# Patient Record
Sex: Male | Born: 1995 | Race: White | Hispanic: No | Marital: Single | Smoking: Never smoker
Health system: Southern US, Community
[De-identification: ages and names within clinical notes are randomized; demographics above are authoritative.]

## PROBLEM LIST (undated history)

## (undated) HISTORY — PX: EXTERNAL EAR SURGERY: SHX627

## (undated) HISTORY — PX: ADENOIDECTOMY: SUR15

## (undated) HISTORY — PX: HYDROCELE EXCISION / REPAIR: SUR1145

---

## 2018-01-27 ENCOUNTER — Ambulatory Visit
Admission: EM | Admit: 2018-01-27 | Discharge: 2018-01-27 | Disposition: A | Discharge status: Home / Self Care | Payer: Commercial Managed Care - PPO | Attending: Family Medicine | Admitting: Family Medicine

## 2018-01-27 ENCOUNTER — Other Ambulatory Visit: Payer: Self-pay

## 2018-01-27 DIAGNOSIS — M25511 Pain in right shoulder: Secondary | ICD-10-CM | POA: Diagnosis not present

## 2018-01-27 MED ORDER — MELOXICAM 15 MG PO TABS: 15.0000 mg | ORAL_TABLET | Freq: Every day | ORAL | 0 refills | Status: DC

## 2018-01-27 NOTE — ED Provider Notes (Signed)
[facility]    CSN: [id number] Arrival date & time: 01/27/18  1200     History   Chief Complaint Chief Complaint  Patient presents with  . Shoulder Pain    right    HPI Kyle Tran is a 22 y.o. male.   HPI  -year-old male presents with right shoulder pain that started yesterday as he was putting on his shirt from shower.  Shoulder has been aching ever since.  He states with motion he feels a crackling sensation in his shoulder.  Has  neck has not been bothering him.  Able to move his shoulder through full range fairly comfortably.  He works in [employer] at [employer].  Will at times have to lift heavy items by himself if other team members are not available.  However he does not remember any specific incident that may have caused his symptoms.  They are busier than normal while setting up for black Friday.  Denies any numbness or tingling.         History reviewed. No pertinent past medical history.  There are no active problems to display for this patient.   Past Surgical History:  Procedure Laterality Date  . EXTERNAL EAR SURGERY         Home Medications    Prior to Admission medications   Medication Sig Start Date End Date Taking? Authorizing Provider  meloxicam (MOBIC) 15 MG tablet Take 1 tablet (15 mg total) by mouth daily. 01/27/18   Lutricia Feiloemer, Suhaas Agena P, PA-C    Family History History reviewed. No pertinent family history.  Social History Social History   Tobacco Use  . Smoking status: Never Smoker  . Smokeless tobacco: Never Used  Substance Use Topics  . Alcohol use: Yes    Comment: rarely  . Drug use: Not Currently     Allergies   Ceftin [cefuroxime]   Review of Systems Review of Systems  Constitutional: Positive for activity change. Negative for appetite change, chills, fatigue and fever.  Musculoskeletal: Positive for myalgias. Negative for neck pain and neck stiffness.  All other systems reviewed and  are negative.    Physical Exam Triage Vital Signs ED Triage Vitals  Enc Vitals Group     BP 01/27/18 1222 119/73     Pulse Rate 01/27/18 1222 78     Resp 01/27/18 1222 18     Temp 01/27/18 1222 99 F (37.2 C)     Temp Source 01/27/18 1222 Oral     SpO2 01/27/18 1222 100 %     Weight 01/27/18 1220 205 lb (93 kg)     Height 01/27/18 1220 6\' 1"  (1.854 m)     Head Circumference --      Peak Flow --      Pain Score 01/27/18 1220 2     Pain Loc --      Pain Edu? --      Excl. in GC? --    No data found.  Updated Vital Signs BP 119/73 (BP Location: Left Arm)   Pulse 78   Temp 99 F (37.2 C) (Oral)   Resp 18   Ht 6\' 1"  (1.854 m)   Wt 205 lb (93 kg)   SpO2 100%   BMI 27.05 kg/m   Visual Acuity Right Eye Distance:   Left Eye Distance:   Bilateral Distance:    Right Eye Near:   Left Eye Near:    Bilateral Near:     Physical Exam  Constitutional: He is oriented to person, place, and time. He appears well-developed and well-nourished. No distress.  HENT:  Head: Normocephalic.  Eyes: Pupils are equal, round, and reactive to light. Right eye exhibits no discharge. Left eye exhibits no discharge.  Neck: Normal range of motion. Neck supple.  Musculoskeletal: Normal range of motion.  Exam of the cervical spine shows full range of motion that is comfortable.  Exam of the right shoulder shows range of motion both actively and passively.  Has  good pulses distally.  Negative arm drop test and a negative if he can test but does complain of a tightness that he feels in his shoulder.  No tenderness of the clavicle subacromial or coracoid process.  Good strength of the arm.  Neurological: He is alert and oriented to person, place, and time.  Skin: Skin is warm and dry. He is not diaphoretic.  Psychiatric: He has a normal mood and affect. His behavior is normal. Judgment and thought content normal.  Nursing note and vitals reviewed.    UC Treatments / Results  Labs (all labs  ordered are listed, but only abnormal results are displayed) Labs Reviewed - No data to display  EKG None  Radiology No results found.  Procedures Procedures (including critical care time)  Medications Ordered in UC Medications - No data to display  Initial Impression / Assessment and Plan / UC Course  I have reviewed the triage vital signs and the nursing notes.  Pertinent labs & imaging results that were available during my care of the patient were reviewed by me and considered in my medical decision making (see chart for details).   Explained to the patient that his examination was fairly normal with mild symptoms and little  abnormalities.  Advised him to avoid symptoms much as possible.  I have told him we will treat this conservatively and does not indicate the need for x-rays at this point.  We will start him on anti-inflammatory medications and have him ice the shoulder particularly after working.  Heavy lifting I have asked him to make sure he has another team member assist him.  He continues to have pain or if it worsens he should return to our clinic or be seen by orthopedic surgery   Final Clinical Impressions(s) / UC Diagnoses   Final diagnoses:  Acute pain of right shoulder     Discharge Instructions     Apply ice 20 minutes out of every 2 hours 4-5 times daily for comfort.    ED Prescriptions    Medication Sig Dispense Auth. Provider   meloxicam (MOBIC) 15 MG tablet Take 1 tablet (15 mg total) by mouth daily. 30 tablet Lutricia Feil, PA-C     Controlled Substance Prescriptions [place] Controlled Substance Registry consulted? Not Applicable   Lutricia Feil, PA-C 01/27/18 1323

## 2018-01-27 NOTE — ED Triage Notes (Signed)
Patient complains of right shoulder pain that started yesterday morning and states that shoulder has been aching since. Denies known injury.

## 2018-01-27 NOTE — Discharge Instructions (Addendum)
Apply ice 20 minutes out of every 2 hours 4-5 times daily for comfort.  °

## 2018-08-02 ENCOUNTER — Other Ambulatory Visit: Payer: Self-pay

## 2018-08-02 ENCOUNTER — Emergency Department
Admission: EM | Admit: 2018-08-02 | Discharge: 2018-08-02 | Disposition: A | Discharge status: Home / Self Care | Payer: Commercial Managed Care - PPO | Attending: Emergency Medicine | Admitting: Emergency Medicine

## 2018-08-02 ENCOUNTER — Ambulatory Visit
Admission: EM | Admit: 2018-08-02 | Discharge: 2018-08-02 | Disposition: A | Discharge status: Hospice | Payer: Commercial Managed Care - PPO | Source: Home / Self Care

## 2018-08-02 DIAGNOSIS — S61411A Laceration without foreign body of right hand, initial encounter: Secondary | ICD-10-CM | POA: Diagnosis present

## 2018-08-02 DIAGNOSIS — Y999 Unspecified external cause status: Secondary | ICD-10-CM | POA: Insufficient documentation

## 2018-08-02 DIAGNOSIS — Y9389 Activity, other specified: Secondary | ICD-10-CM | POA: Insufficient documentation

## 2018-08-02 DIAGNOSIS — W228XXA Striking against or struck by other objects, initial encounter: Secondary | ICD-10-CM | POA: Insufficient documentation

## 2018-08-02 DIAGNOSIS — Y929 Unspecified place or not applicable: Secondary | ICD-10-CM | POA: Diagnosis not present

## 2018-08-02 MED ORDER — SULFAMETHOXAZOLE-TRIMETHOPRIM 800-160 MG PO TABS: 1.0000 | ORAL_TABLET | Freq: Two times a day (BID) | ORAL | 0 refills | Status: AC

## 2018-08-02 MED ORDER — LIDOCAINE HCL 1 % IJ SOLN
5.0000 mL | Freq: Once | INTRAMUSCULAR | Status: AC
  Administered 2018-08-02: 20:00:00 5 mL
  Filled 2018-08-02: qty 10

## 2018-08-02 MED ORDER — LIDOCAINE HCL (PF) 1 % IJ SOLN
INTRAMUSCULAR | Status: AC
  Filled 2018-08-02: qty 5

## 2018-08-02 MED ORDER — BACITRACIN-NEOMYCIN-POLYMYXIN 400-5-5000 EX OINT
TOPICAL_OINTMENT | CUTANEOUS | Status: AC
  Filled 2018-08-02: qty 1

## 2018-08-02 NOTE — ED Provider Notes (Signed)
[facility] Emergency Department Provider Note  ____________________________________________  Time seen: Approximately 11:38 PM  I have reviewed the triage vital signs and the nursing notes.   HISTORY  Chief Complaint Laceration    HPI Kyle Tran is a 23 y.o. male presents to the emergency department with a 1 cm laceration along the dorsal aspect of the right hand after patient was working on his car.   He denies numbness and tingling of the right upper extremity.  His tetanus status is up-to-date.  No other alleviating measures have been attempted.        History reviewed. No pertinent past medical history.  There are no active problems to display for this patient.   Past Surgical History:  Procedure Laterality Date  . EXTERNAL EAR SURGERY      Prior to Admission medications   Medication Sig Start Date End Date Taking? Authorizing Provider  meloxicam (MOBIC) 15 MG tablet Take 1 tablet (15 mg total) by mouth daily. 01/27/18   Lutricia Feil, PA-C  sulfamethoxazole-trimethoprim (BACTRIM DS) 800-160 MG tablet Take 1 tablet by mouth 2 (two) times daily for 7 days. 08/02/18 08/09/18  Orvil Feil, PA-C    Allergies Ceftin [cefuroxime]  No family history on file.  Social History Social History   Tobacco Use  . Smoking status: Never Smoker  . Smokeless tobacco: Never Used  Substance Use Topics  . Alcohol use: Yes    Comment: rarely  . Drug use: Not Currently     Review of Systems  Constitutional: No fever/chills Eyes: No visual changes. No discharge ENT: No upper respiratory complaints. Cardiovascular: no chest pain. Respiratory: no cough. No SOB. Gastrointestinal: No abdominal pain.  No nausea, no vomiting.  No diarrhea.  No constipation. Musculoskeletal: Patient has right hand pain.  Skin: Negative for rash, abrasions, lacerations, ecchymosis. Neurological: Negative for headaches, focal weakness or  numbness.   ____________________________________________   PHYSICAL EXAM:  VITAL SIGNS: ED Triage Vitals  Enc Vitals Group     BP 08/02/18 1614 (!) 114/99     Pulse Rate 08/02/18 1614 72     Resp 08/02/18 1614 17     Temp 08/02/18 1614 99 F (37.2 C)     Temp Source 08/02/18 1614 Oral     SpO2 08/02/18 1614 100 %     Weight 08/02/18 1615 194 lb (88 kg)     Height 08/02/18 1615 6\' 1"  (1.854 m)     Head Circumference --      Peak Flow --      Pain Score 08/02/18 1615 6     Pain Loc --      Pain Edu? --      Excl. in GC? --      Constitutional: Alert and oriented. Well appearing and in no acute distress. Eyes: Conjunctivae are normal. PERRL. EOMI. Head: Atraumatic. Cardiovascular: Normal rate, regular rhythm. Normal S1 and S2.  Good peripheral circulation. Respiratory: Normal respiratory effort without tachypnea or retractions. Lungs CTAB. Good air entry to the bases with no decreased or absent breath sounds. Musculoskeletal: Full range of motion to all extremities. No gross deformities appreciated. Neurologic:  Normal speech and language. No gross focal neurologic deficits are appreciated.  Skin: Patient has 1 cm laceration along the dorsal aspect of the right hand. Psychiatric: Mood and affect are normal. Speech and behavior are normal. Patient exhibits appropriate insight and judgement.   ____________________________________________   LABS (all labs ordered are listed, but only  abnormal results are displayed)  Labs Reviewed - No data to display ____________________________________________  EKG   ____________________________________________  RADIOLOGY  No results found.  ____________________________________________    PROCEDURES  Procedure(s) performed:    Procedures  LACERATION REPAIR Performed by: Orvil FeilJaclyn M Waleska Buttery Authorized by: Orvil FeilJaclyn M Volney Reierson Consent: Verbal consent obtained. Risks and benefits: risks, benefits and alternatives were  discussed Consent given by: patient Patient identity confirmed: provided demographic data Prepped and Draped in normal sterile fashion Wound explored  Laceration Location: Dorsal aspect of right hand.   Laceration Length: 1 cm  No Foreign Bodies seen or palpated  Anesthesia: local infiltration  Local anesthetic: lidocaine 1% without epinephrine  Anesthetic total: 2 ml  Irrigation method: syringe Amount of cleaning: standard  Skin closure: 4-0 Ethilon   Number of sutures: 3  Technique: Simple Interrupted   Patient tolerance: Patient tolerated the procedure well with no immediate complications.   Medications  lidocaine (XYLOCAINE) 1 % (with pres) injection 5 mL (5 mLs Infiltration Given by Other 08/02/18 1940)     ____________________________________________   INITIAL IMPRESSION / ASSESSMENT AND PLAN / ED COURSE  Pertinent labs & imaging results that were available during my care of the patient were reviewed by me and considered in my medical decision making (see chart for details).  Review of the [place] CSRS was performed in accordance of the [facility] prior to dispensing any controlled drugs.         Assessment and plan Hand laceration Patient presents to the emergency department with a laceration along the dorsal aspect of the right hand.  Laceration was repaired in the emergency department without complication.  I advised sutures to be removed in 1 week.  Patient was discharged with Keflex.  Patient requested a work note stating that he should not use his right hand for 1 week.   ____________________________________________  FINAL CLINICAL IMPRESSION(S) / ED DIAGNOSES  Final diagnoses:  Laceration of right hand without foreign body, initial encounter      NEW MEDICATIONS STARTED DURING THIS VISIT:  ED Discharge Orders         Ordered    sulfamethoxazole-trimethoprim (BACTRIM DS) 800-160 MG tablet  2 times daily     08/02/18 1946              This  chart was dictated using voice recognition software/Dragon. Despite best efforts to proofread, errors can occur which can change the meaning. Any change was purely unintentional.    Orvil FeilWoods, Lashunta Frieden M, PA-C 08/02/18 2342    Emily FilbertWilliams, Jonathan E, MD 08/03/18 [phone]

## 2018-08-02 NOTE — ED Triage Notes (Signed)
Pt states he was working on his car and accidentally hit his right 3rd knuckle on something and has a small laceration noted with controlled bleeding.

## 2018-08-02 NOTE — Discharge Instructions (Addendum)
Take Bactrim twice daily for the next 7 days. Keep wound clean and dry for the next 24 hours. Have sutures removed after 1 week.

## 2019-02-08 ENCOUNTER — Ambulatory Visit (INDEPENDENT_AMBULATORY_CARE_PROVIDER_SITE_OTHER): Payer: Commercial Managed Care - PPO

## 2019-02-08 ENCOUNTER — Other Ambulatory Visit: Payer: Self-pay

## 2019-02-08 ENCOUNTER — Ambulatory Visit
Admission: EM | Admit: 2019-02-08 | Discharge: 2019-02-08 | Disposition: A | Discharge status: Home / Self Care | Payer: Commercial Managed Care - PPO | Attending: Family Medicine | Admitting: Family Medicine

## 2019-02-08 DIAGNOSIS — W0110XA Fall on same level from slipping, tripping and stumbling with subsequent striking against unspecified object, initial encounter: Secondary | ICD-10-CM | POA: Diagnosis not present

## 2019-02-08 DIAGNOSIS — S62664A Nondisplaced fracture of distal phalanx of right ring finger, initial encounter for closed fracture: Secondary | ICD-10-CM

## 2019-02-08 DIAGNOSIS — M79644 Pain in right finger(s): Secondary | ICD-10-CM | POA: Diagnosis not present

## 2019-02-08 MED ORDER — KETOROLAC TROMETHAMINE 10 MG PO TABS: 10.0000 mg | ORAL_TABLET | Freq: Four times a day (QID) | ORAL | 0 refills | Status: DC | PRN

## 2019-02-08 NOTE — ED Provider Notes (Signed)
[facility]    CSN: [id number] Arrival date & time: 02/08/19  1812  History   Chief Complaint Chief Complaint  Patient presents with  . Finger Injury   HPI  23 year old male presents with a finger injury.  Patient reports that he suffered a fall yesterday. He tried to catch himself/break his fall and in doing so he injured his right ring finger. Localized the pain to the distal aspect of the finger. Worse with pressure and extension of the finger. Better with flexion at the PIP joint. No bruising. Pain is moderate in severity (7/10). No other reported symptoms.   PMH, Surgical Hx, Family Hx, Social History reviewed and updated as below.  PMH: Acne  Surgical Hx: TONSILECTOMY, ADENOIDECTOMY, BILATERAL MYRINGOTOMY AND TUBES      HYDROCELE EXCISION / REPAIR 2002     Home Medications    Prior to Admission medications   Medication Sig Start Date End Date Taking? Authorizing Provider  ketorolac (TORADOL) 10 MG tablet Take 1 tablet (10 mg total) by mouth every 6 (six) hours as needed for moderate pain or severe pain. 02/08/19   Coral Spikes, DO   Family Hx: Hypertension Maternal Grandmother     Social History Social History   Tobacco Use  . Smoking status: Never Smoker  . Smokeless tobacco: Never Used  Substance Use Topics  . Alcohol use: Yes    Comment: rarely  . Drug use: Not Currently     Allergies   Ceftin [cefuroxime]   Review of Systems Review of Systems  Constitutional: Negative.   Musculoskeletal:       Finger injury.   Physical Exam Triage Vital Signs ED Triage Vitals [02/08/19 1826]  Enc Vitals Group     BP 116/67     Pulse Rate 78     Resp 18     Temp 98.3 F (36.8 C)     Temp Source Oral     SpO2 100 %     Weight 210 lb (95.3 kg)     Height 6\' 2"  (1.88 m)     Head Circumference      Peak Flow      Pain Score 7     Pain Loc      Pain Edu?      Excl. in [place]?    Updated Vital Signs BP 116/67 (BP Location: Left Arm)    Pulse 78   Temp 98.3 F (36.8 C) (Oral)   Resp 18   Ht 6\' 2"  (1.88 m)   Wt 95.3 kg   SpO2 100%   BMI 26.96 kg/m   Visual Acuity Right Eye Distance:   Left Eye Distance:   Bilateral Distance:    Right Eye Near:   Left Eye Near:    Bilateral Near:     Physical Exam Vitals signs and nursing note reviewed.  Constitutional:      General: He is not in acute distress.    Appearance: Normal appearance. He is not ill-appearing.  HENT:     Head: Normocephalic and atraumatic.  Eyes:     General:        Right eye: No discharge.        Left eye: No discharge.     Conjunctiva/sclera: Conjunctivae normal.  Cardiovascular:     Rate and Rhythm: Normal rate and regular rhythm.     Heart sounds: No murmur.  Pulmonary:     Effort: Pulmonary effort is normal.  Breath sounds: Normal breath sounds. No wheezing, rhonchi or rales.  Musculoskeletal:     Comments: Right 4th digit - tenderness just distal to the DIP joint (palmar aspect). Pain extension at the PIP joint.   Skin:    General: Skin is warm.     Findings: No bruising.  Neurological:     Mental Status: He is alert.  Psychiatric:        Mood and Affect: Mood normal.        Behavior: Behavior normal.    UC Treatments / Results  Labs (all labs ordered are listed, but only abnormal results are displayed) Labs Reviewed - No data to display  EKG   Radiology Dg Finger Ring Right  Result Date: 02/08/2019 CLINICAL DATA:  Ring finger injury EXAM: RIGHT RING FINGER 2+V COMPARISON:  None. FINDINGS: Acute nondisplaced fracture involving the proximal shaft of the fourth distal phalanx best seen on lateral view. No subluxation. No radiopaque foreign body IMPRESSION: Acute nondisplaced fracture involving the proximal shaft of the fourth distal phalanx Electronically Signed   By: Jasmine Pang M.D.   On: 02/08/2019 18:56    Procedures Procedures (including critical care time)  Medications Ordered in UC Medications - No data to  display  Initial Impression / Assessment and Plan / UC Course  I have reviewed the triage vital signs and the nursing notes.  Pertinent labs & imaging results that were available during my care of the patient were reviewed by me and considered in my medical decision making (see chart for details).    23 year old male presents with an acute fracture of the proximal shaft of the 4th distal phalanx.  Placed in splint. Toradol for pain. Follow up with Ortho.  Final Clinical Impressions(s) / UC Diagnoses   Final diagnoses:  Closed nondisplaced fracture of distal phalanx of right ring finger, initial encounter     Discharge Instructions     Medication as needed.  Follow up with [facility].  Take care  Dr. Adriana Simas    ED Prescriptions    Medication Sig Dispense Auth. Provider   ketorolac (TORADOL) 10 MG tablet Take 1 tablet (10 mg total) by mouth every 6 (six) hours as needed for moderate pain or severe pain. 20 tablet Tommie Sams, DO     PDMP not reviewed this encounter.   Tommie Sams, [place] 02/08/19 2310

## 2019-02-08 NOTE — Discharge Instructions (Signed)
Medication as needed.  Follow up with [facility].  Take care  Dr. Lacinda Axon

## 2019-02-08 NOTE — ED Triage Notes (Addendum)
Reports fell backwards yesterday and when he caught himself, he injured right hand ring finger. Unable to straighten digit without severe pain.  No pain with palpation to finger, pain when straightening finger.

## 2019-07-26 ENCOUNTER — Encounter: Payer: Self-pay | Admitting: Emergency Medicine

## 2019-07-26 ENCOUNTER — Emergency Department
Admission: EM | Admit: 2019-07-26 | Discharge: 2019-07-26 | Disposition: A | Discharge status: Home / Self Care | Payer: Worker's Compensation | Attending: Emergency Medicine | Admitting: Emergency Medicine

## 2019-07-26 ENCOUNTER — Other Ambulatory Visit: Payer: Self-pay

## 2019-07-26 DIAGNOSIS — Z23 Encounter for immunization: Secondary | ICD-10-CM | POA: Insufficient documentation

## 2019-07-26 DIAGNOSIS — Y9389 Activity, other specified: Secondary | ICD-10-CM | POA: Insufficient documentation

## 2019-07-26 DIAGNOSIS — S0101XA Laceration without foreign body of scalp, initial encounter: Secondary | ICD-10-CM | POA: Insufficient documentation

## 2019-07-26 DIAGNOSIS — Y9289 Other specified places as the place of occurrence of the external cause: Secondary | ICD-10-CM | POA: Diagnosis not present

## 2019-07-26 DIAGNOSIS — W2209XA Striking against other stationary object, initial encounter: Secondary | ICD-10-CM | POA: Diagnosis not present

## 2019-07-26 DIAGNOSIS — Y990 Civilian activity done for income or pay: Secondary | ICD-10-CM | POA: Diagnosis not present

## 2019-07-26 DIAGNOSIS — S0990XA Unspecified injury of head, initial encounter: Secondary | ICD-10-CM

## 2019-07-26 MED ORDER — TETANUS-DIPHTH-ACELL PERTUSSIS 5-2.5-18.5 LF-MCG/0.5 IM SUSP
0.5000 mL | Freq: Once | INTRAMUSCULAR | Status: AC
  Administered 2019-07-26: 0.5 mL via INTRAMUSCULAR
  Filled 2019-07-26: qty 0.5

## 2019-07-26 MED ORDER — ACETAMINOPHEN 325 MG PO TABS
650.0000 mg | ORAL_TABLET | Freq: Once | ORAL | Status: AC
  Administered 2019-07-26: 650 mg via ORAL
  Filled 2019-07-26: qty 2

## 2019-07-26 NOTE — ED Triage Notes (Signed)
Stood up and hit head on a car while at work. Laceration to top of head.  Bleeding controlled.  No LOC.   AAOx3.  Skin warm and dry.  MAE equally and strong.  Gait steady.

## 2019-07-26 NOTE — ED Notes (Signed)
See triage note  States he hit his head on metal     States he was under a car on a lift

## 2019-07-26 NOTE — ED Provider Notes (Signed)
[facility] Emergency Department Provider Note  ____________________________________________  Time seen: Approximately 5:57 PM  I have reviewed the triage vital signs and the nursing notes.   HISTORY  Chief Complaint Head Injury    HPI Kyle Tran is a 24 y.o. male that presents to the emergency department for evaluation of head injury today.  Patient hit the top of his head on the corner of a car.  He did not lose consciousness.  Area has been bleeding.  He has had a headache since injury.  He is unsure of last tetanus.  No dizziness, photophobia, nausea, vomiting.   History reviewed. No pertinent past medical history.  There are no problems to display for this patient.   Past Surgical History:  Procedure Laterality Date  . EXTERNAL EAR SURGERY      Prior to Admission medications   Not on File    Allergies Ceftin [cefuroxime]  No family history on file.  Social History Social History   Tobacco Use  . Smoking status: Never Smoker  . Smokeless tobacco: Never Used  Substance Use Topics  . Alcohol use: Yes    Comment: rarely  . Drug use: Not Currently     Review of Systems  Respiratory: No SOB. Gastrointestinal: No nausea, no vomiting.  Musculoskeletal: Negative for musculoskeletal pain. Skin: Negative for rash, abrasions, ecchymosis.  Positive for laceration. Neurological: Negative for headaches, numbness or tingling   ____________________________________________   PHYSICAL EXAM:  VITAL SIGNS: ED Triage Vitals  Enc Vitals Group     BP 07/26/19 1547 121/68     Pulse Rate 07/26/19 1547 69     Resp 07/26/19 1547 16     Temp 07/26/19 1547 98.4 F (36.9 C)     Temp Source 07/26/19 1547 Oral     SpO2 07/26/19 1547 99 %     Weight 07/26/19 1546 210 lb 1.6 oz (95.3 kg)     Height 07/26/19 1546 6\' 2"  (1.88 m)     Head Circumference --      Peak Flow --      Pain Score 07/26/19 1546 7     Pain Loc --      Pain Edu? --      Excl. in GC? --      Constitutional: Alert and oriented. Well appearing and in no acute distress. Eyes: Conjunctivae are normal. PERRL. EOMI. Head: 1/2 cm laceration to superior head. ENT:      Ears:      Nose: No congestion/rhinnorhea.      Mouth/Throat: Mucous membranes are moist.  Neck: No stridor.  No cervical spine tenderness to palpation. Cardiovascular: Normal rate, regular rhythm.  Good peripheral circulation. Respiratory: Normal respiratory effort without tachypnea or retractions. Lungs CTAB. Good air entry to the bases with no decreased or absent breath sounds. Gastrointestinal: Bowel sounds 4 quadrants. Soft and nontender to palpation. No guarding or rigidity. No palpable masses. No distention.  Musculoskeletal: Full range of motion to all extremities. No gross deformities appreciated. Neurologic:  Normal speech and language. No gross focal neurologic deficits are appreciated.  Skin:  Skin is warm, dry and intact.  Psychiatric: Mood and affect are normal. Speech and behavior are normal. Patient exhibits appropriate insight and judgement.   ____________________________________________   LABS (all labs ordered are listed, but only abnormal results are displayed)  Labs Reviewed - No data to display ____________________________________________  EKG   ____________________________________________  RADIOLOGY   No results found.  ____________________________________________  PROCEDURES  Procedure(s) performed:    Procedures  LACERATION REPAIR Performed by: Laban Emperor  Consent: Verbal consent obtained.  Consent given by: patient  Prepped and Draped in normal sterile fashion  Wound explored: No foreign bodies   Laceration Location: head  Laceration Length: 1/4 cm  Anesthesia: None  Local anesthetic: None  Irrigation method: syringe  Amount of cleaning: 578ml normal saline  Skin closure: Dermabond  Patient tolerance: Patient tolerated  the procedure well with no immediate complications.  Medications  Tdap (BOOSTRIX) injection 0.5 mL (0.5 mLs Intramuscular Given 07/26/19 1833)  acetaminophen (TYLENOL) tablet 650 mg (650 mg Oral Given 07/26/19 1832)     ____________________________________________   INITIAL IMPRESSION / ASSESSMENT AND PLAN / ED COURSE  Pertinent labs & imaging results that were available during my care of the patient were reviewed by me and considered in my medical decision making (see chart for details).  Review of the [place] CSRS was performed in accordance of the [place] prior to dispensing any controlled drugs.     Patient's diagnosis is consistent with head injury.  Vital signs and exam are reassuring.  Laceration was repaired with Dermabond.  Tetanus shot was updated.  We discussed risks and benefits of head CT at this time.  Patient did not lose consciousness and has a minor headache now but no additional symptoms.  We will hold off on head CT at this time.  Patient will follow up with primary care this week. Patient is to follow up with primary care as directed. Patient is given ED precautions to return to the ED for any worsening or new symptoms.   Kyle Tran was evaluated in Emergency Department on 07/26/2019 for the symptoms described in the history of present illness. He was evaluated in the context of the global COVID-19 pandemic, which necessitated consideration that the patient might be at risk for infection with the SARS-CoV-2 virus that causes COVID-19. Institutional protocols and algorithms that pertain to the evaluation of patients at risk for COVID-19 are in a state of rapid change based on information released by regulatory bodies including the CDC and federal and state organizations. These policies and algorithms were followed during the patient's care in the ED.  ____________________________________________  FINAL CLINICAL IMPRESSION(S) / ED DIAGNOSES  Final diagnoses:  Injury of  head, initial encounter      NEW MEDICATIONS STARTED DURING THIS VISIT:  ED Discharge Orders    None          This chart was dictated using voice recognition software/Dragon. Despite best efforts to proofread, errors can occur which can change the meaning. Any change was purely unintentional.    Laban Emperor, PA-C 07/26/19 1907    Harvest Dark, MD 07/26/19 2135

## 2020-11-12 ENCOUNTER — Ambulatory Visit
Admission: EM | Admit: 2020-11-12 | Discharge: 2020-11-12 | Disposition: A | Discharge status: Home / Self Care | Payer: Commercial Managed Care - PPO | Attending: Physician Assistant | Admitting: Physician Assistant

## 2020-11-12 ENCOUNTER — Other Ambulatory Visit: Payer: Self-pay

## 2020-11-12 ENCOUNTER — Encounter: Payer: Self-pay | Admitting: Emergency Medicine

## 2020-11-12 DIAGNOSIS — J029 Acute pharyngitis, unspecified: Secondary | ICD-10-CM | POA: Diagnosis present

## 2020-11-12 DIAGNOSIS — Z20822 Contact with and (suspected) exposure to covid-19: Secondary | ICD-10-CM | POA: Insufficient documentation

## 2020-11-12 LAB — POCT RAPID STREP A: Streptococcus, Group A Screen (Direct): NEGATIVE

## 2020-11-12 MED ORDER — AZITHROMYCIN 500 MG PO TABS: 500.0000 mg | ORAL_TABLET | Freq: Every day | ORAL | 0 refills | Status: DC

## 2020-11-12 NOTE — ED Provider Notes (Signed)
[facility]    CSN: [id number] Arrival date & time: 11/12/20  0855      History   Chief Complaint Chief Complaint  Patient presents with   Cough   Headache    HPI Kyle Tran is a 25 y.o. male who presents with HA, cough, ST, chills, sweats, subjective fever x 2 days. Has not been around anyone sick that he knows of. It hurts to eat.     History reviewed. No pertinent past medical history.  There are no problems to display for this patient.   Past Surgical History:  Procedure Laterality Date   EXTERNAL EAR SURGERY       Home Medications    Prior to Admission medications   Medication Sig Start Date End Date Taking? Authorizing Provider  azithromycin (ZITHROMAX) 500 MG tablet Take 1 tablet (500 mg total) by mouth daily. 11/12/20  Yes Rodriguez-Southworth, Viviana Simpler    Family History History reviewed. No pertinent family history.  Social History Social History   Tobacco Use   Smoking status: Never   Smokeless tobacco: Never  Vaping Use   Vaping Use: Never used  Substance Use Topics   Alcohol use: Yes    Comment: rarely   Drug use: Not Currently     Allergies   Ceftin [cefuroxime]   Review of Systems Review of Systems  Constitutional:  Positive for appetite change, chills, diaphoresis, fatigue and fever.  HENT:  Positive for postnasal drip, rhinorrhea and sore throat. Negative for congestion, ear discharge, ear pain and trouble swallowing.   Eyes:  Negative for discharge.  Respiratory:  Positive for cough.   Gastrointestinal:  Negative for abdominal pain, diarrhea, nausea and vomiting.  Musculoskeletal:  Positive for myalgias. Negative for gait problem.  Neurological:  Positive for headaches.  Hematological:  Negative for adenopathy.    Physical Exam Triage Vital Signs ED Triage Vitals  Enc Vitals Group     BP 11/12/20 0912 122/83     Pulse Rate 11/12/20 0912 91     Resp 11/12/20 0912 18     Temp 11/12/20 0912 100 F (37.8  C)     Temp Source 11/12/20 0912 Oral     SpO2 11/12/20 0912 97 %     Weight 11/12/20 0910 210 lb 1.6 oz (95.3 kg)     Height 11/12/20 0910 6\' 2"  (1.88 m)     Head Circumference --      Peak Flow --      Pain Score 11/12/20 0909 6     Pain Loc --      Pain Edu? --      Excl. in GC? --    No data found.  Updated Vital Signs BP 122/83 (BP Location: Right Arm)   Pulse 91   Temp 100 F (37.8 C) (Oral)   Resp 18   Ht 6\' 2"  (1.88 m)   Wt 210 lb 1.6 oz (95.3 kg)   SpO2 97%   BMI 26.98 kg/m   Visual Acuity Right Eye Distance:   Left Eye Distance:   Bilateral Distance:    Right Eye Near:   Left Eye Near:    Bilateral Near:     Physical Exam Physical Exam Vitals signs and nursing note reviewed.  Constitutional:      General: he is not in acute distress.    Appearance: Normal appearance. he is not ill-appearing, toxic-appearing or diaphoretic.  HENT:     Head: Normocephalic.     Right  Ear: Tympanic membrane, ear canal and external ear normal.     Left Ear: Tympanic membrane, ear canal and external ear normal.     Nose: Nose normal.     Mouth/Throat:     Mouth: Mucous membranes are moist.  Eyes:     General: No scleral icterus.       Right eye: No discharge.        Left eye: No discharge.     Conjunctiva/sclera: Conjunctivae normal.  Neck:     Musculoskeletal: Neck supple. No neck rigidity.  Cardiovascular:     Rate and Rhythm: Normal rate and regular rhythm.     Heart sounds: No murmur.  Pulmonary:     Effort: Pulmonary effort is normal.     Breath sounds: Normal breath sounds.    Musculoskeletal: Normal range of motion.  Lymphadenopathy:     Cervical: No cervical adenopathy.  Skin:    General: Skin is warm and dry.     Coloration: Skin is not jaundiced.     Findings: No rash.  Neurological:     Mental Status: he is alert and oriented to person, place, and time.     Gait: Gait normal.  Psychiatric:        Mood and Affect: Mood normal.        Behavior:  Behavior normal.        Thought Content: Thought content normal.        Judgment: Judgment normal.    UC Treatments / Results  Labs (all labs ordered are listed, but only abnormal results are displayed) Labs Reviewed  SARS CORONAVIRUS 2 (TAT 6-24 HRS)  CULTURE, GROUP A STREP [facility])  POCT RAPID STREP A, ED / UC  POCT RAPID STREP A   Rapid strep is neg.   EKG   Radiology No results found.  Procedures Procedures (including critical care time)  Medications Ordered in UC Medications - No data to display  Initial Impression / Assessment and Plan / UC Course  I have reviewed the triage vital signs and the nursing notes. Pertinent labs  results that were available during my care of the patient were reviewed by me and considered in my medical decision making (see chart for details). Covid suspect Tonsillitis Covid test is pending and we will call him if positive. But also advised to check on Mychart.  I placed him on Zithromax as noted until the culture is back. Supportive care advised. See instructions    Final Clinical Impressions(s) / UC Diagnoses   Final diagnoses:  Suspected COVID-19 virus infection  Acute pharyngitis, unspecified etiology     Discharge Instructions      You may take Dayquil and Nyquil as directed per the box Start Emergency to help your immune system fight this illness.  If your Covid test ends up positive you may take the following supplements to help your immune system be stronger to fight this viral infection Take Quarcetin 500 mg three times a day x 7 days with Zinc 50 mg ones a day x 7 days. The quarcetin is an antiviral and anti-inflammatory supplement which helps open the zinc channels in the cell to absorb Zinc. Zinc helps decrease the virus load in your body. Take Melatonin 6-10 mg at bed time which also helps support your immune system.  Also make sure to take Vit D 5,000 IU per day with a fatty meal and Vit C 5000 mg a day until you are  completely better. To prevent viral  illnesses your vitamin D should be between 60-80. Don't lay around, keep active and walk as much as you are able to to prevent worsening of your symptoms.  Follow up with your family Dr next week.  If you get short of breath and you are able to check  your oxygen with a pulse oxygen meter, if it gets to 92% or less, you need to go to the hospital to be admitted. If you dont have one, come back here and we will assess you.       ED Prescriptions     Medication Sig Dispense Auth. Provider   azithromycin (ZITHROMAX) 500 MG tablet Take 1 tablet (500 mg total) by mouth daily. 5 tablet Rodriguez-Southworth, Nettie Elm, PA-C      PDMP not reviewed this encounter.   Garey Ham, PA-C 11/12/20 1501

## 2020-11-12 NOTE — ED Triage Notes (Signed)
Pt c/o headache, cough, sore throat, chills, sweats, and subjective fever. Started about 2 days ago.

## 2020-11-12 NOTE — Discharge Instructions (Signed)
You may take Dayquil and Nyquil as directed per the box Start Emergency to help your immune system fight this illness.  If your Covid test ends up positive you may take the following supplements to help your immune system be stronger to fight this viral infection Take Quarcetin 500 mg three times a day x 7 days with Zinc 50 mg ones a day x 7 days. The quarcetin is an antiviral and anti-inflammatory supplement which helps open the zinc channels in the cell to absorb Zinc. Zinc helps decrease the virus load in your body. Take Melatonin 6-10 mg at bed time which also helps support your immune system.  Also make sure to take Vit D 5,000 IU per day with a fatty meal and Vit C 5000 mg a day until you are completely better. To prevent viral illnesses your vitamin D should be between 60-80. Don't lay around, keep active and walk as much as you are able to to prevent worsening of your symptoms.  Follow up with your family Dr next week.  If you get short of breath and you are able to check  your oxygen with a pulse oxygen meter, if it gets to 92% or less, you need to go to the hospital to be admitted. If you dont have one, come back here and we will assess you.

## 2020-11-13 LAB — SARS CORONAVIRUS 2 (TAT 6-24 HRS): SARS Coronavirus 2: POSITIVE — AB

## 2020-11-15 LAB — CULTURE, GROUP A STREP (THRC)

## 2021-04-23 ENCOUNTER — Other Ambulatory Visit: Payer: Self-pay

## 2021-04-23 ENCOUNTER — Ambulatory Visit
Admission: EM | Admit: 2021-04-23 | Discharge: 2021-04-23 | Disposition: A | Discharge status: Home / Self Care | Payer: Commercial Managed Care - PPO | Attending: Internal Medicine | Admitting: Internal Medicine

## 2021-04-23 DIAGNOSIS — I493 Ventricular premature depolarization: Secondary | ICD-10-CM | POA: Insufficient documentation

## 2021-04-23 LAB — BASIC METABOLIC PANEL
Anion gap: 7 (ref 5–15)
BUN: 17 mg/dL (ref 6–20)
CO2: 28 mmol/L (ref 22–32)
Calcium: 9.2 mg/dL (ref 8.9–10.3)
Chloride: 101 mmol/L (ref 98–111)
Creatinine, Ser: 1 mg/dL (ref 0.61–1.24)
GFR, Estimated: >60 mL/min (ref >60)
Glucose, Bld: 78 mg/dL (ref 70–99)
Potassium: 4.5 mmol/L (ref 3.5–5.1)
Sodium: 136 mmol/L (ref 135–145)

## 2021-04-23 LAB — CBC WITH DIFFERENTIAL/PLATELET
Abs Immature Granulocytes: 0.02 10*3/uL (ref 0.00–0.07)
Basophils Absolute: 0.1 10*3/uL (ref 0.0–0.1)
Basophils Relative: 1 %
Eosinophils Absolute: 0.2 10*3/uL (ref 0.0–0.5)
Eosinophils Relative: 3 %
HCT: 45.1 % (ref 39.0–52.0)
Hemoglobin: 15.5 g/dL (ref 13.0–17.0)
Immature Granulocytes: 0 %
Lymphocytes Relative: 26 %
Lymphs Abs: 1.9 10*3/uL (ref 0.7–4.0)
MCH: 28.9 pg (ref 26.0–34.0)
MCHC: 34.4 g/dL (ref 30.0–36.0)
MCV: 84.1 fL (ref 80.0–100.0)
Monocytes Absolute: 0.6 10*3/uL (ref 0.1–1.0)
Monocytes Relative: 8 %
Neutro Abs: 4.7 10*3/uL (ref 1.7–7.7)
Neutrophils Relative %: 62 %
Platelets: 254 10*3/uL (ref 150–400)
RBC: 5.36 MIL/uL (ref 4.22–5.81)
RDW: 12 % (ref 11.5–15.5)
WBC: 7.5 10*3/uL (ref 4.0–10.5)
nRBC: 0 % (ref 0.0–0.2)

## 2021-04-23 LAB — TSH: TSH: 4.961 u[IU]/mL — ABNORMAL HIGH (ref 0.350–4.500)

## 2021-04-23 NOTE — ED Provider Notes (Signed)
[facility]    CSN: [id number] Arrival date & time: 04/23/21  1347      History   Chief Complaint Chief Complaint  Patient presents with   Irregular Heart Beat    HPI Kyle Tran is a 26 y.o. male comes to the urgent care with a 2 to 3-day history of sensation of missing a heartbeat.  Patient says symptoms started Saturday and has been persistent.  It is intermittent.  It is not associated with dizziness, near fainting or fainting episodes.  Patient denies fever, chest pain, vomiting or diarrhea.  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  He endorses poor oral intake when he is at work.  No chest pain or chest pressure.  He denies tobacco use.  No energy drink use.  He drinks 1 bottle of Coke or Pepsi a day.  No illicit drug use.  No heavy alcohol use.  HPI  History reviewed. No pertinent past medical history.  There are no problems to display for this patient.   Past Surgical History:  Procedure Laterality Date   EXTERNAL EAR SURGERY         Home Medications    Prior to Admission medications   Not on File    Family History History reviewed. No pertinent family history.  Social History Social History   Tobacco Use   Smoking status: Never   Smokeless tobacco: Never  Vaping Use   Vaping Use: Never used  Substance Use Topics   Alcohol use: Yes    Comment: rarely   Drug use: Not Currently     Allergies   Ceftin [cefuroxime] and Cefuroxime axetil   Review of Systems Review of Systems  Constitutional: Negative.   HENT: Negative.    Cardiovascular:  Positive for palpitations. Negative for chest pain.  Gastrointestinal: Negative.   Neurological: Negative.     Physical Exam Triage Vital Signs ED Triage Vitals  Enc Vitals Group     BP 04/23/21 1427 120/63     Pulse Rate 04/23/21 1427 84     Resp 04/23/21 1427 18     Temp 04/23/21 1427 98.6 F (37 C)     Temp Source 04/23/21 1427 Temporal     SpO2 04/23/21 1427 100 %      Weight --      Height --      Head Circumference --      Peak Flow --      Pain Score 04/23/21 1425 0     Pain Loc --      Pain Edu? --      Excl. in GC? --    No data found.  Updated Vital Signs BP 120/63 (BP Location: Left Arm)    Pulse 84    Temp 98.6 F (37 C) (Temporal)    Resp 18    SpO2 100%   Visual Acuity Right Eye Distance:   Left Eye Distance:   Bilateral Distance:    Right Eye Near:   Left Eye Near:    Bilateral Near:     Physical Exam Vitals and nursing note reviewed.  Constitutional:      General: He is not in acute distress.    Appearance: He is not ill-appearing.  Cardiovascular:     Rate and Rhythm: Normal rate and regular rhythm.     Pulses: Normal pulses.     Heart sounds: Normal heart sounds.  Pulmonary:     Effort: Pulmonary effort is  normal. No respiratory distress.     Breath sounds: Normal breath sounds. No stridor. No wheezing or rhonchi.  Abdominal:     General: Bowel sounds are normal.     Palpations: Abdomen is soft.  Neurological:     Mental Status: He is alert.     UC Treatments / Results  Labs (all labs ordered are listed, but only abnormal results are displayed) Labs Reviewed  BASIC METABOLIC PANEL  CBC WITH DIFFERENTIAL/PLATELET  TSH    EKG   Radiology No results found.  Procedures Procedures (including critical care time)  Medications Ordered in UC Medications - No data to display  Initial Impression / Assessment and Plan / UC Course  I have reviewed the triage vital signs and the nursing notes.  Pertinent labs & imaging results that were available during my care of the patient were reviewed by me and considered in my medical decision making (see chart for details).     1.  Premature ventricular contractions: CBC, BMP, TSH Patient advised to increase oral fluid intake EKG shows normal sinus rhythm with no acute ST/T wave changes. Reassurance given If symptoms persist patient may benefit from cardiology  evaluation. Return to ED precautions given. Final Clinical Impressions(s) / UC Diagnoses   Final diagnoses:  PVC's (premature ventricular contractions)     Discharge Instructions      Increase oral fluid intake We will call you with recommendations if labs are abnormal If you experience dizziness, lightheadedness, near fainting or fainting episodes please go to the emergency room to be evaluated.   ED Prescriptions   None    PDMP not reviewed this encounter.   Merrilee Jansky, MD 04/23/21 1534

## 2021-04-23 NOTE — ED Triage Notes (Signed)
Patient presents to Urgent Care with complaints of irregular heart rhythm. One episode Saturday and another episode that started at 12 noon at work.   Denies chest pain or SOB.

## 2021-04-23 NOTE — Discharge Instructions (Signed)
Increase oral fluid intake We will call you with recommendations if labs are abnormal If you experience dizziness, lightheadedness, near fainting or fainting episodes please go to the emergency room to be evaluated.

## 2021-06-27 IMAGING — CR DG FINGER RING 2+V*R*
3 series · 3 of 3 positions shown · non-contrast
Comparison: None.

CLINICAL DATA: Ring finger injury

EXAM:
RIGHT RING FINGER 2+V

[finger ap]
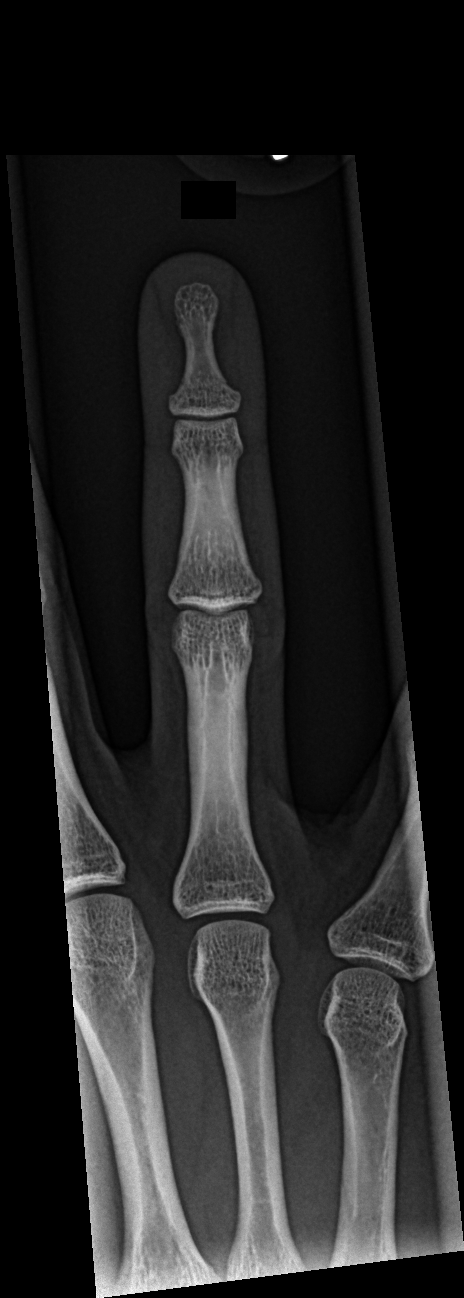

[finger obl]
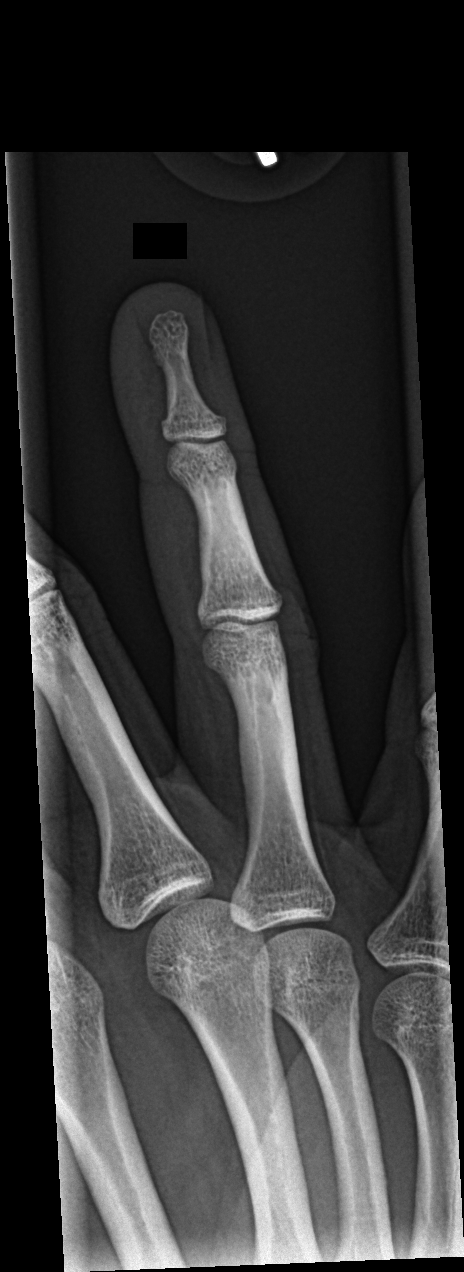

[finger lat]
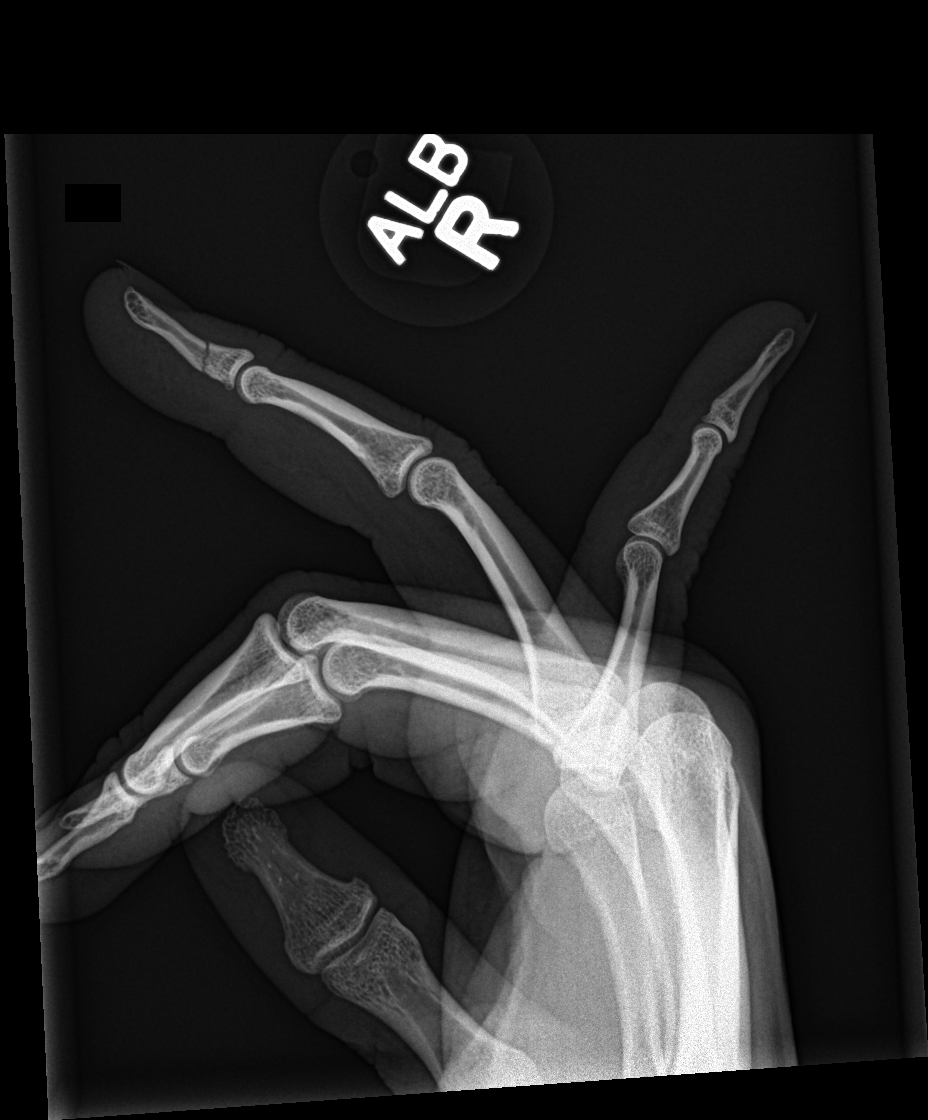

[3 of 3 positions shown; findings below may reference images not displayed]

FINDINGS: Acute nondisplaced fracture involving the proximal shaft of the
fourth distal phalanx best seen on lateral view. No subluxation. No
radiopaque foreign body
IMPRESSION: Acute nondisplaced fracture involving the proximal shaft of the
fourth distal phalanx

## 2023-10-10 DIAGNOSIS — S060XAA Concussion with loss of consciousness status unknown, initial encounter: Secondary | ICD-10-CM

## 2023-10-10 HISTORY — DX: Concussion with loss of consciousness status unknown, initial encounter: S06.0XAA

## 2023-12-10 ENCOUNTER — Encounter: Payer: Self-pay | Admitting: Physical Medicine & Rehabilitation

## 2024-01-21 ENCOUNTER — Encounter
Payer: Worker's Compensation | Attending: Physical Medicine & Rehabilitation | Admitting: Physical Medicine & Rehabilitation

## 2024-01-21 ENCOUNTER — Encounter: Payer: Self-pay | Admitting: Physical Medicine & Rehabilitation

## 2024-01-21 VITALS — BP 104/74 | HR 80 | Ht 74.0 in | Wt 296.0 lb

## 2024-01-21 DIAGNOSIS — G44309 Post-traumatic headache, unspecified, not intractable: Secondary | ICD-10-CM | POA: Insufficient documentation

## 2024-01-21 DIAGNOSIS — G44329 Chronic post-traumatic headache, not intractable: Secondary | ICD-10-CM | POA: Diagnosis not present

## 2024-01-21 DIAGNOSIS — F0781 Postconcussional syndrome: Secondary | ICD-10-CM | POA: Insufficient documentation

## 2024-01-21 DIAGNOSIS — G479 Sleep disorder, unspecified: Secondary | ICD-10-CM | POA: Diagnosis not present

## 2024-01-21 DIAGNOSIS — H8112 Benign paroxysmal vertigo, left ear: Secondary | ICD-10-CM | POA: Insufficient documentation

## 2024-01-21 MED ORDER — TRAZODONE HCL 50 MG PO TABS: 25.0000 mg | ORAL_TABLET | Freq: Every day | ORAL | 2 refills | Status: DC

## 2024-01-21 MED ORDER — ONDANSETRON 4 MG PO TBDP: 4.0000 mg | ORAL_TABLET | Freq: Three times a day (TID) | ORAL | 4 refills | Status: AC | PRN

## 2024-01-21 NOTE — Patient Instructions (Addendum)
 DO'S and DON'TS for Sleeps    For people whose only medical complaint is, I can't sleep well, or I can't get to sleep easily, taking sleeping preparations may do more harm than good. Most authorities on sleep recommend against the use of sedative drugs by these people for the following reasons:     >> Sedatives modify nervous system activity during sleep; for example, they may reduce the normal periods of dreaming. After taking sedatives for a while and then stopping, many people report they have sleep-disrupting dreams, which cause them to wake up feeling tired even after a full night's sleep.   >> The human body develops tolerance to sedatives after their repeated use. After a while, you have to take more and more sedative to make you feel sleepy.   >> A person can become psychologically dependent on sleeping preparations; ifyou are convinced that's the only way you can get a good night's sleep, you won't be able to go to sleep without a drug.     Non-drug aids to sleep  Before asking your pharmacist or your doctor for something to help you sleep, give the following suggestions a fair try:   1. Go to bed and rise at about the same times every day. Establishing a schedule helps regulate your body's inner clock. Also try to establish a sleep routine by following the same bedtime preparations each night, thereby telling yourself it's bedtime before you get into bed.   2. Make sure your sleeping conditions, including your bed, are as comfortable as possible. If you are sharing your bed with a snoring, cover-stealing, or restless partner, make separate, temporary sleeping arrangements until you reestablish a satisfactory sleeping pattern.   3. Wear loose-fitting nightclothes. The more comfortable you are, the better you will sleep.   4. Keep your bedroom darkened. If street lights shine in your room or if you must sleep during the day, buy room-darkening shades or blinds.   5. Keep your bedroom as quiet  as possible. If you can't block outside noise, cover it with a familiar inside noise such as the steady hum of a fan or other soothing noise.   6. Avoid taking an alcoholic drink-including beer or wine-before bedtime. When alcohol wears off during the night, you may experience periods of wakefulness.   7. Avoid too much mental stimulation during the hour or so prior to bedtime. Read a light novel or watch a relaxing TV program; don't finish office work or discuss family finances with your spouse, for example. Consider learning relaxation methods or meditation to help relax prior to bedtime.  8. Avoid using your bedroom for working or watching television. Learn to associate that room with sleep.   9. If you can't sleep, get up and pursue some relaxing activity, such as reading or knitting, until you feel sleepy; do not lie in bed worrying about getting to sleep.   10. Avoid daytime napping, which tends to fragment sleep at night.   11. Avoid all caffeine-containing beverages after 2pm. Remember that many soft drinks, as well as coffee and tea, contain caffeine.   12. Try to get some exercise each day. Regular walks, bicycle rides, or whatever exercise you enjoy may help you sleep better. However, avoid vigorous exercise later than 3 hours before bedtime.  13. Maximize bright light exposure in the early mornings (natural sunlight is best, light boxes with 10,000 Lux intensity can also help), but avoid bright lights in the evening (including computer screens which  are usually fairly bright and close to the eyes when used.)        Relaxation techniques include deep-breathing exercises, meditation, light stretching, and listening to relaxing music or other sounds. Used before bedtime, these can help empty your mind of stressful thoughts, relax your muscles and ease the day's tension.     STIMULUS CONTROL  Stimulus control is an attempt to eliminate associations that trigger or stimulate you to stay awake.  The intent is to form a psychological connection between the bedroom and sleeping. Recommendations include:  - Using the bedroom only for sleep and sex. The bedroom should not be used for watching TV, reading, eating, or working, since each of these activities stimulates wakefulness.   - Mix up your evening routine to help prevent 'anticipatory insomnia'. (Don't brush your teeth just before bed, if doing so gets you started thinking about having difficulty initiating sleep that night, etc.)  - Go to bed only when you are sleepy; if your brain is not ready for sleep, you will only lay in bed awake.   - If you are unable to fall asleep in 15 or 20 minutes, get out of bed and go into another room and do relaxing activities until you become sleepy. (Avoid bright lighting or other factors that stimulate wakefulness.)   - Make sure the bedroom is conducive to sleep (comfortable temperature, quiet, dark, etc.)  - Practice good sleep hygiene -- the habits that help ensure good sleep.        TRY TAKING 25MG  TRAZODONE AT 8PM WITH YOUR MELATONIN FOR 3 NIGHTS. IF NO IMPROVEMENT IN SLEEP, THEN INCREASE TRAZODONE TO 50MG 

## 2024-01-21 NOTE — Progress Notes (Signed)
 Subjective:    Patient ID: Kyle Tran, male    DOB: 08-Aug-1995, 28 y.o.   MRN: [id number]  HPI  This is an initial office visit for [name] who is a 28 year old male involved in a injury on October 20, 2023 when he stood up and hit his head on a car lift right on the top of his head.  Apparently there is no loss of consciousness.  Patient works as a teaching laboratory technician.  He had persistent headaches, dizziness, and light-sensitivity which didn't improve. He left work the next day and ultimately had a CT scan performed in the emergency room on August 19 which was apparently negative for any acute changes.  He has had persistent headaches and has struggles with driving as he develops nausea and vomiting.  Also reports of problems with focus and concentration, word finding and problem solving. He can feel like he's disassociated, in a lucid dream.  In total he's been to urgent care and the ED. Urgent care recommended a concussions specialization.   There is a history of head injury about 2 to 3 years ago, but it appears that it was a extracranial wound fortunately.  He's had ringing in his ears and flashing lights at times which can happen when he experiences loud sounds. The flashing usually happens with eyes closed.  For his headaches he has taken ibuprofen and Tylenol  and rizatriptan without significant relief.  He has used meclizine for dizziness. Zofran can help the nausea.   Headaches were pounding until a month ago. They have improved to the point where they only bother him when he's fatigued, or physically taxed, bright light too.   He has to drive about 40 minutes to work. When he drives for longer periods or even when he's simply at home, turning his head really bothers him and stimulates dizziness and nausea. He has been able to drive in low traffic times for short dx. He tried working out and became nauseas quickly as well.  He'll close his eyes to help with symptoms.   He  feels irritable at times given his situation. He denies feeling depressed.   When he first was injured he was sleeping continuously. Now, over the last several weeks he's struggled to sleep. He usually tries to go to bed around 8pm and will fall asleep if he takes a melatonin ?10-15mg  when he lays down.. If not he won't fall asleep. With melatonin he may sleep 10-12 hours although the sleep can be broken every 1-2 hours, occasionally with weird dreams.   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    Pain Inventory Average Pain 4 Pain Right Now 4 My pain is constant, burning, and aching  LOCATION OF PAIN  headaches  BOWEL Number of stools per week: 7 Oral laxative use No    BLADDER Normal    Mobility walk without assistance ability to climb steps?  yes do you drive?  no Do you have any goals in this area?  yes  Function employed # of hrs/week on medical leave since 10/2023 Do you have any goals in this area?  yes  Neuro/Psych dizziness confusion  Prior Studies Any changes since last visit?  no  Physicians involved in your care Any changes since last visit?  no   History reviewed. No pertinent family history. Social History   Socioeconomic History   Marital status: Single    Spouse name: Not  on file   Number of children: Not on file   Years of education: Not on file   Highest education level: Not on file  Occupational History   Not on file  Tobacco Use   Smoking status: Never   Smokeless tobacco: Never  Vaping Use   Vaping status: Never Used  Substance and Sexual Activity   Alcohol use: Yes    Comment: rarely   Drug use: Not Currently   Sexual activity: Not on file  Other Topics Concern   Not on file  Social History Narrative   Not on file   Social Drivers of Health   Financial Resource Strain: Low Risk (04/27/2021)   Received from [facility]   Overall Financial Resource Strain (CARDIA)    Difficulty of  Paying Living Expenses: Not hard at all  Food Insecurity: No Food Insecurity (11/04/2023)   Received from [facility]   Hunger Vital Sign    Within the past 12 months, you worried that your food would run out before you got the money to buy more.: Never true    Within the past 12 months, the food you bought just didn't last and you didn't have money to get more.: Never true  Transportation Needs: No Transportation Needs (11/04/2023)   Received from [facility] - Transportation    Lack of Transportation (Medical): No    Lack of Transportation (Non-Medical): No  Physical Activity: Not on file  Stress: Not on file  Social Connections: Not on file   Past Surgical History:  Procedure Laterality Date   ADENOIDECTOMY     age 2   EXTERNAL EAR SURGERY     HYDROCELE EXCISION / REPAIR     age 80   Past Medical History:  Diagnosis Date   Concussion 10/2023   There were no vitals taken for this visit.  Opioid Risk Score:   Fall Risk Score:  `1  Depression screen PHQ 2/9      No data to display          Review of Systems  Eyes:  Positive for visual disturbance.  Gastrointestinal:  Positive for nausea and vomiting.  Neurological:  Positive for dizziness and headaches.  Psychiatric/Behavioral:  Positive for confusion.   All other systems reviewed and are negative.      Objective:   Physical Exam Gen: no distress, normal appearing HEENT: oral mucosa pink and moist, NCAT Cardio: Reg rate Chest: normal effort, normal rate of breathing Abd: soft, non-distended Ext: no edema Psych: pleasant, normal affect Skin: intact Neuro: He is alert and oriented x 3.  Demonstrates reasonable insight and awareness.  Cranial nerve exam was nonfocal.  Hearing appeared to be intact as well as vision.  Patient with lateral and some rotary nystagmus with gaze to the left more than right.  Hallpike Dix maneuver stimulated lateral nystagmus on the left.  Patient was symptomatic with  the movements above.  Cognitively the patient was able to spell the word world forward and backwards and sequence basic numbers without issue.  He is able to perform serial sevens all at once without error.  Basic algebra was intact.  Abstract thinking was appropriate.  He recalled 3 words after 5 minutes.  He was somewhat aware of current affairs.  Motor testing was notable for 5 out of 5 strength in all 4 limbs.  Sensory exam was intact.  No tremors or ataxia was appreciated.  Normal tone is noted.  Romberg's test was slightly positive with a backward lean today.  Tandem gait was normal. Musculoskeletal: Full ROM, No pain with AROM or PROM in the neck, trunk, or extremities. Posture appropriate.  He was a bit sensitive to palpation over the top of his head along the frontal temporal parietal area.       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  He has ongoing sleep dysfunction as well as higher level cognitive deficits, vestibular dysfunction, headaches, photo sensitivity and sound sensitivity as well as irritable behavior   Plan: Overall he has made progress from a neurological standpoint it appears compared to August.  I do think the most important piece here is improving his sleep.  He can continue with his melatonin for now but we will add trazodone 25 to 50 mg at bedtime with a goal of 8 to 10 hours of continuous, restorative sleep. We also discussed sleep hygiene. I think some of his other symptoms including his headaches cognition and even his mood should improve as his energy levels and sleep are better.  If not we will discuss other options to treat headaches etc. May referral to outpatient PT at [facility] for vestibular therapy.  He should respond well to this.  I am okay with him driving as long as his low traffic and only short distances Made referral to outpatient speech  therapy to [facility] for higher level cognitive assessment.  I spent about an hour with Ole in review of his case and treatment plan.  I also reviewed the information with his case manager.  He is to remain out of work until I see him back in about 6 weeks time.

## 2024-02-03 ENCOUNTER — Ambulatory Visit: Payer: Worker's Compensation

## 2024-02-03 ENCOUNTER — Ambulatory Visit: Payer: Worker's Compensation | Attending: Physical Medicine & Rehabilitation

## 2024-02-03 DIAGNOSIS — S069XAS Unspecified intracranial injury with loss of consciousness status unknown, sequela: Secondary | ICD-10-CM | POA: Diagnosis not present

## 2024-02-03 DIAGNOSIS — F0781 Postconcussional syndrome: Secondary | ICD-10-CM | POA: Diagnosis present

## 2024-02-03 DIAGNOSIS — G44309 Post-traumatic headache, unspecified, not intractable: Secondary | ICD-10-CM | POA: Diagnosis present

## 2024-02-03 DIAGNOSIS — H8112 Benign paroxysmal vertigo, left ear: Secondary | ICD-10-CM | POA: Insufficient documentation

## 2024-02-03 DIAGNOSIS — R42 Dizziness and giddiness: Secondary | ICD-10-CM | POA: Diagnosis present

## 2024-02-03 NOTE — Therapy (Unsigned)
 OUTPATIENT PHYSICAL THERAPY EVALUATION  Patient Name: Kyle Tran MRN: [id number] DOB:02-21-1996, 28 y.o., male Today's Date: 02/04/2024  PCP: Camelia Sherwood BIRCH, FNP  REFERRING PROVIDER: Babs Arthea ONEIDA, MD  END OF SESSION:  PT End of Session - 02/03/24 1450     Visit Number 1    Number of Visits 16    Date for Recertification  03/30/24    Authorization Type Workers Comp: Authorized for 12 visits    Authorization Time Period 02/03/24-03/30/24    Authorization - Visit Number 1    Authorization - Number of Visits 12    Progress Note Due on Visit 10    PT Start Time 1447    PT Stop Time 1527    PT Time Calculation (min) 40 min    Equipment Utilized During Treatment --    Activity Tolerance Patient tolerated treatment well;No increased pain    Behavior During Therapy [facility] for tasks assessed/performed          Past Medical History:  Diagnosis Date   Concussion 10/2023   Past Surgical History:  Procedure Laterality Date   ADENOIDECTOMY     age 28   EXTERNAL EAR SURGERY     HYDROCELE EXCISION / REPAIR     age 89   Patient Active Problem List   Diagnosis Date Noted   Post concussion syndrome 01/21/2024   BPPV (benign paroxysmal positional vertigo), left 01/21/2024   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   Sleep disorder 01/21/2024    ONSET DATE: August 2025  REFERRING DIAG: Post concussion syndrome, vertigo, diplopia   THERAPY DIAG:  Post concussion syndrome  Rationale for Evaluation and Treatment: Rehabilitation  SUBJECTIVE:                                                                                                                                                                                             SUBJECTIVE STATEMENT: Pt sustained a concussion in August, still having a lot of difficulty tolerating typical activities due to easily triggered HA, migraine with visual and auditory components.   PAIN:  6/10 across top, front, center of head, bilat  and central, non-throbbing.   NAUSEA:  0/10  PERTINENT HISTORY:   [name] is a 28yoM referred to OPPT vestibular rehab by physiatry following a head injury at work. Pt reports rising to full upright stance during a mechanical task and hitting the top/back of his head on a hydraulic lift arm. Pt denies LOC. Pt reports initially being very limited by vertigo, dizziness, HA which resolved over days to weeks. In most recent weeks prior to evaluation here, pt denies neck pain,  but endorses vertigo/e triggers with repeated left head turning and leftward gaze, he relates to needs for highway driving. Pt is still very much unable to tolerate most head movements for driving, but is tolerant as a front seat passenger. Pt reports 2 other primary triggers as flashing and/or bright lights which can result trigger migraine aura pt seeing bright flashing lights not present, and loud noises can trigger brief, loud Left sided tinnitus several times daily, typically 20sec duration. Pt has been going to the gym with mom 2-3x weekly, mostly treadmill walking, sometimes resistance training, trying to find a degree of exertion that does not trigger migraine symptoms. Pt reports significant sleep disruption overall, continues to attempt a regular sleep schedule as recommended by MD, but has responded unfavorably to medications to help with sleep. EMR review indication pt being followed in 2023 for chronic HA + fatigue+ memory concerns.   EMR review shows prior head injury in 2021- he had a striking head injury and that in the past six months he feels his short term memory has diminished, particularly while working. Pt pursued laceration repair at [facility] ED 07/2019 after striking his head on corner of car at work, no loss of consciousness reported at that time and no indication for imaging. He followed up with [facility] 0611/21 for this issue, head x-ray at that time was negative and pt was diagnosed with post-traumatic  headache. 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   Established with neurology Dr. Maree in 2023- recommended sleep study to evaluate OSA, recommended being established with opthalmolog, brain MRI reassuring, referral to neuropsychology for further cognitive assessment, vitamin D supplementation. Does not appear pt followed up with neurology after July 2023, despite recommendations.  PAIN PATTERN AT EVALULATION:  Are you having pain? 6/10 headache at present, elevated since waking today ,non throbbing, in blue distribution. Pt has HA daily, lowest severity in past week 3/10. Patient describes orange distribution as pattern of HA when having migraine, occurs daily.       PRECAUTIONS: photophobia, phonophobia   WEIGHT BEARING RESTRICTIONS: None  FALLS: Has patient fallen in last 6 months? 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  OBJECTIVE:  Note: Objective measures were completed at evaluation unless otherwise noted.  DIAGNOSTIC FINDINGS:   HeadCT in 2021: reassuring findings  Brain MRI: 2023: unremarkable  COGNITION: Overall cognitive status: Not evaluated by provider; pt has a referral to speech therapy for high-level cognitive screening.   ORTHOSTATIC VITAL SIGNS: -seated BP: 120/48mmHg 90bpm  -standing BP: 126/18mmHg 146bpm per cuff, 111bpm from pleth -standing BP x 1 minutes: 115/77mmHg 115bpm *pt was seen in UC in 2023 with heart palpitations, sense of missing beats, EKG normal at that time, no definitive workup.     Oculomotor Screening: 02/03/24   Gaze fixation Continuous twitching without any clear  pattern  horizontal pursuits WNL  Rt/Lt  gaze holding WNL, nystagmus free  vertical pursuits WNL  up/down gaze holding WNL  horizontal saccades   intermittent small  undershooting  vergence  16cm   Horizontal VOR WNL  Vision suppressed:   neutral gaze, spontaneous nystagmus Deferred to visit 2  Rt/Lt  gaze holding Deferred to visit 2  up/down gaze holding Deferred to visit 2   VESTIBULAR SCREENING: Cervical rotation ROM:  Right: 75 degrees, no pain or symptoms   Left: 65 degrees, makes left eye feel 'funny', no neck pain, no dizziness  Cross cover test:  -negative  Head Impulse Test: -Right: negative   -Left: negative   CANAL TESTING: -Will defer to 2nd visit    GAIT: -Pt has been AMB on treadmill at gym prior to evlauation, up to 1 mile at 3.0 mph, often has onset migraine symptoms immediately following activity, and is trying to decrease to less triggering amount of time.   -in clinic pt AMB 414ft overground, no device, no LOB, no trouble with various lighting types except when glancing and looking directly into LED overhead light fixture.  -gait speed congruent with age/activity level of population norms  -Post AMB vitals consistent with standing vitals taken preAMB; -pleth continues to show aberrant wave pattern which warrants EKG testing in future (will report to MD)   FUNCTIONAL TESTS:  Will capture on visit 2   PATIENT SURVEYS:  Will capture on visit 2                                                                                                                              TREATMENT DATE 02/03/24:   -education on sleep hygiene, graded progression to moderate intensity exercise, HR training for specific intensity grading. -education on vital signs, irregular plinth, elevated HR -Gaze fixation with alternate cervical rotation for VOR retraining: 2x30sec  -VOR cancellation during cervical rotation with headlamp laser: 1x20  -Pencil pushup with playing card:  1x15x3secH  PATIENT EDUCATION: Education details: See above Person educated: Cam  Education method: collaborative learning, deliberate practice, positive reinforcement, explicit instruction, establish rules. Education comprehension: Good   HOME EXERCISE PROGRAM: Gaze fixation with alternate cervical rotation for VOR retraining: 2x30sec  VOR cancellation during cervical rotation with headlamp laser: 1x20  Pencil pushup with playing card: 1x15x3secH   GOALS: Goals reviewed with patient? No  SHORT TERM GOALS: Target date: 03/04/24  Pt to demonstrate 15x left head turns in <90sec without increased vertigo or migraine.  Baseline: Goal status: INITIAL  2.  Pt to report tolerance to daily walking moderate intensity >15 minutes to promote brain recovery and general wellness.  Baseline:  Goal status: INITIAL  3.  Pt to report regular performance and compliance with HEP for visual accomodation training.  Baseline:  Goal status: INITIAL  4. Pt to report tolerance to driving >89 minutes without increased migraine or dizziness symptoms.   Baseline:   Goal Status: INITIAL  LONG TERM GOALS: Target date: 03/30/24  Pt to show >25% improvement on Post Concussion Symptom Scale (PCSS)  Baseline: to be assessed  Goal status: INITIAL  2.  Pt to report tolerance of AMB >14min 3x/week or greater without increased migraine or vertigo to indicate progress toward readiness for return to  IADL.  Baseline:  Goal status: INITIAL  3.  Pt to report ability to perform moderate intensity resistance exercise >22min, 2-3x/week or greater to show improved readiness for return to working on his cars and perform heavy home care activity.  Baseline:  Goal status: INITIAL  4.  Pt to report ability to tolerance driving >59 minutes without increased dizziness or migraine to allow for return to driving to work.  Baseline:  Goal status: INITIAL   ASSESSMENT:  CLINICAL IMPRESSION: 28yoM referred to OPPT  after head injury and ongoing post concussion dizziness, vertigo, migraine. Examination this date revealing of ongoing phonophobia, photophobia, symptom intolerance to moderate resistance activity and repeated head movements. Pt has impaired oculomotor function in testing, assessment of visual system somewhat triggering to dizziness. Pt has done well with recommendations from physiatry for sleep hygiene and progressive return to exercise, however these remains very much disrupted and off baseline. Pt started on visual accomodation training exercises this date and asked to begin these at home when not in symptoms exacerbation. Will seek to complete majority of vestibular screening and surveys next visit. Will need to relay information regarding irregular plinth to referring provider, likely warrants EKG assessment. Patient will benefit from skilled physical therapy intervention to reduce deficits and impairments identified in evaluation, in order to reduce pain, improve quality of life, and maximize activity tolerance for ADL, IADL, and leisure/fitness. Physical therapy will help pt achieve long and short term goals of care.    OBJECTIVE IMPAIRMENTS: Decreased knowledge of condition, decreased use of DME, decreased mobility, difficulty walking, decreased strength, decreased ROM. ACTIVITY LIMITATIONS: Lifting, standing, walking, squatting, transfers, locomotion level PARTICIPATION LIMITATIONS: Cleaning, laundry, interpersonal relationships, driving, yardwork, community activity.  PERSONAL FACTORS: Age, behavior pattern, education, past/current experiences, transportation, profession  are also affecting patient's functional outcome.  REHAB POTENTIAL: Good CLINICAL DECISION MAKING: Medium  EVALUATION COMPLEXITY: Moderate    PLAN:  PT FREQUENCY: 1x/week  PT DURATION: 12 weeks  PLANNED INTERVENTIONS: 97110-Therapeutic exercises, 97530- Therapeutic activity, [id number]- Neuromuscular re-education, 97535- Self  Care, 02859- Manual therapy, [phone]- Gait training, [phone]- Electrical stimulation (unattended), [phone]- Electrical stimulation (manual), Patient/Family education, Balance training, Stair training, Vestibular training, Visual/preceptual remediation/compensation, Cognitive remediation, Cryotherapy, and Moist heat  PLAN FOR NEXT SESSION:  PCSS, r/o canal etiology, review HEP exercises for tolerance and accuracy, check orthostatic vitals again, emphasis on manual pulse assessment.  9:32 AM, 02/04/24 Peggye JAYSON Linear, PT, DPT Physical Therapist - [facility]  Outpatient Physical Therapy- Main Campus [phone]     Arab C, PT 02/04/2024, 8:12 AM

## 2024-02-04 ENCOUNTER — Telehealth: Payer: Self-pay

## 2024-02-04 NOTE — Telephone Encounter (Signed)
 Author contacting PCP office regarding abnormal vitals at PT evaluation 02/04/24- tachycardia upon standing 140s. Pt also reports HRM at gym treadmill indicating HR in 150s when walking. Pt asymptomatic. Pleth waveform abnormal, recommend EKG to assess for possible arhythmia.   Left message with provider office, Levorn Snide back in office on Monday.     3:24 PM, 02/04/24 Peggye JAYSON Linear, PT, DPT Physical Therapist - [facility]  Outpatient Physical Therapy- Main Campus [phone]

## 2024-02-09 ENCOUNTER — Ambulatory Visit

## 2024-02-10 ENCOUNTER — Ambulatory Visit

## 2024-02-10 ENCOUNTER — Ambulatory Visit: Payer: Worker's Compensation | Admitting: Speech Pathology

## 2024-02-10 DIAGNOSIS — S069XAS Unspecified intracranial injury with loss of consciousness status unknown, sequela: Secondary | ICD-10-CM | POA: Diagnosis present

## 2024-02-10 DIAGNOSIS — R41841 Cognitive communication deficit: Secondary | ICD-10-CM | POA: Diagnosis present

## 2024-02-10 DIAGNOSIS — F0781 Postconcussional syndrome: Secondary | ICD-10-CM | POA: Insufficient documentation

## 2024-02-10 DIAGNOSIS — G44309 Post-traumatic headache, unspecified, not intractable: Secondary | ICD-10-CM | POA: Diagnosis present

## 2024-02-10 DIAGNOSIS — R42 Dizziness and giddiness: Secondary | ICD-10-CM | POA: Diagnosis present

## 2024-02-10 NOTE — Therapy (Unsigned)
 OUTPATIENT SPEECH LANGUAGE PATHOLOGY  COGNITION EVALUATION   Patient Name: Kyle Tran MRN: [id number] DOB:1996-02-21, 28 y.o., male Today's Date: 02/10/2024  PCP: Levorn Snide, NP REFERRING PROVIDER: Arthea Gunther, MD   End of Session - 02/10/24 1536     Visit Number 1    Number of Visits 17    Date for Recertification  04/06/24    Authorization Type Generic Worker's Comp    Authorization - Visit Number 1    Authorization - Number of Visits 12    Progress Note Due on Visit 10    SLP Start Time 1530    SLP Stop Time  1615    SLP Time Calculation (min) 45 min    Activity Tolerance Patient tolerated treatment well          Past Medical History:  Diagnosis Date   Concussion 10/2023   Past Surgical History:  Procedure Laterality Date   ADENOIDECTOMY     age 6   EXTERNAL EAR SURGERY     HYDROCELE EXCISION / REPAIR     age 56   Patient Active Problem List   Diagnosis Date Noted   Post concussion syndrome 01/21/2024   BPPV (benign paroxysmal positional vertigo), left 01/21/2024   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     PAIN:  Are you having pain? Level 4 headache, monitored during session   FALLS: Has patient fallen in last 6 months?  See PT evaluation for details  LIVING ENVIRONMENT: Lives with: lives with their family Lives in: House/apartment  PLOF:  Level of assistance: Independent with ADLs, Independent with IADLs Employment: Full-time employment   PATIENT GOALS   to improve cognitive communication  abilities  OBJECTIVE:   COGNITIVE COMMUNICATION Overall cognitive status: Impaired Areas of impairment:  Attention: Impaired: Alternating, Divided Memory: Impaired: Working Holiday Representative function: Impaired: Initiation, Problem solving, Organization, Planning, and Self-correction Functional Impairments: Pt reliant on his mother for money, medication management, driving, recall of information  AUDITORY COMPREHENSION  Overall auditory comprehension: Appears intact YES/NO questions: Appears intact Following directions: Appears intact Conversation: Simple  READING COMPREHENSION: Intact  EXPRESSION: verbal  VERBAL EXPRESSION:   Overall verbal expression: Appears intact Level of generative/spontaneous verbalization: conversation Automatic speech: name: intact and social response: intact  Repetition: Appears intact Pragmatics: Appears intact  WRITTEN EXPRESSION: Dominant hand: right Written expression: Appears intact  ORAL MOTOR EXAMINATION Facial : WFL Lingual: WFL Velum: WFL Mandible: WFL Cough: WFL Voice: WFL  MOTOR SPEECH: Overall motor speech: Appears intact Phonation: normal Resonance: WFL Articulation: Appears intact Intelligibility: Intelligible Motor planning: Appears intact    PATIENT REPORTED OUTCOME MEASURES (PROM): The [facility] Post Concussion Symptoms Questionnaire asks individuals to rate the degreee ot which they experience 16 post-concussion symptoms. Each item is rated on a 5-point ordinal scale: 0=not experienced at all, 1=no more of a problem, 2=a mild problem, 3=a moderate problem and 4=a severe problem. The total score is a sum of all items and ranges from 0 to 64 from best to worst.   Headaches:  2 =Mild Problem Feelings of dizziness: 2 =Mild Problem  Nausea and/or vomiting: 2 =Mild Problem  Noise sensitivity (easily upset by loud noise): 3 =Moderate Problem Sleep disturbance: 2 =Mild Problem Fatigue, tiring more  easily: 3 =Moderate Problem Being irritable, easily angered: 3 =Moderate Problem Feeling depressed or tearful 1 =No More of a Problem Feeling frustrated or impatient: 3 =Moderate Problem Forgetfulness, poor memory: 3 =Moderate Problem Poor concentration: 3 =Moderate Problem Taking longer to think: 3 =Moderate Problem Blurred vision: 2 =Mild Problem Light sensitivity (easily upset by bright light): 3 =Moderate Problem Double vision: 2 =Mild Problem Restlessness: 3 =Moderate Problem Any other difficulties: Ear Ringing: 2=mild problem  RPQ-3 (total for first three items): 6 RPQ-13 (total for next 13 items): 36 moderate burden of symptoms  Mental Fatigue Scale, MFS This self-reported scale includes 15 questions that measure mental fatigue. Questions include affective, cognitive and sensory symptoms, duration of sleep and daytime variation in symptom severity. The questions concern fatigue in general, lack of initiative, mental fatigue, mental recovery, concentration difficulties, memory problems, slowness of thinking, sensitivity to stress, increased tendency to become emotional, irritability, sensitivity to light and noise, decreased or increased sleep as well as 24-hour symptom variations. A rating of 0 corresponds to normal function and 1-3 indicating increasing problem with the symptom. (No (0), Slight (1), Fairly serious (2) and Serious (3) problems). A cut-off score above 10.5 implies a problem for the person, although a serious problem is not always the case.  Pt with score of 20.5  The Behavior Rating Inventory of Executive Function - Adult Version (BRIEF-A) is a measure of adult executive funcitoning for people of 48-44 years of age. The BRIEF-A has 75 items in nine non-overlapping scales: Inhibit, Self-Monitor, Plan/Organize, Shift, Initiate, Task Monitor, Emotional Control, Working Associate Professor. The BRIEF-A contains two summary index scales: Behavioral Regulation  Index (BRI) and Metacognition Index (MI) as well as a scale reflecting overall functioning: Ship Broker Composite (GEC).  T scores (Mean = 50, SD = 10) are used to interpret the individual's level of executive functioning. There score are transformations of raw scale scores.   INHIBIT Scale:  T score of 63 - considered to be above the average range as compared to like-age peers SHIFT Scale: T score of 51 - considered to be within the average range as compared to like-aged peers EMOTIONAL CONTROL Scale: T score of 60 - considered to be within the average range as compared to like-aged peers SELF-MONITOR Scale: T score of 67 - considered to be above the average range as compared to like-age peers  BEHAVIORAL REGULATION INDEX (BRI): T score of 63 - relatively preserved ability to inhibit impulses, modulate emotions, shift problem-solving set, and  monitor his behavior.  INITIATE Scale: T score of 66 - suggests pt has difficulty beginning/starting tasks, activities and problem-solving approaches appropriately WORKING MEMORY Scale: T score of 73 - suggests pt has substantial difficulty with holding an appropriate amount of information in mind PLAN/ORGANIZE Scale: T score of 60 - considered to be within the average range as compared to like-aged peers TASK MONITOR Scale: T Score of 59 - considered to be within the average range as compared to like-aged peers ORGANIZATION OF MATERIALS Scale: T score of 61 - considered to be within the average range as compared to like-aged peers  METACOGNITIVE INDEX ((MI): T Score of 66 - suggests pt has difficulty with  difficulties with initiation, working memory, planning,  organizing, and/or the ability to monitor task-oriented problem solving  GLOBAL EXECUTIVE COMPOSITE (GEC): T score  of 66 elevated   TODAY'S TREATMENT:  Education provided on sleep hygiene, use of calendar and initial organization strategies   PATIENT EDUCATION: Education details:  results of this assessment, ST POC Person educated: Patient Education method: Explanation Education comprehension: verbalized understanding and needs further education   HOME EXERCISE PROGRAM:   As above   GOALS:  Goals reviewed with patient? Yes  SHORT TERM GOALS: Target date: 10 sessions  Pt will take a 10-minute walk once a day (preferably in late morning or early afternoon), and if tolerated (no worsening of symptoms), increase to two walks per day for 10 minutes each, for 2 weeks Baseline: Goal status: INITIAL  2.  Each day, pt will do a maximum of 10 minutes of reading or low-demand computer/phone use, followed by a 10-minute break (timer-based), for 5 days this week Baseline:  Goal status: INITIAL  3.  Pt will use a simple daily planner starting tomorrow,  write down 5 tasks (can include rest, chores, self-care, light activity) each morning, and mark when completed. Do this daily for 4 weeks. Baseline:  Goal status: INITIAL  4.  Pt will keep a simple symptom/energy log each evening for 30 days: note headache, dizziness, fatigue, mood on 0-10 scale; note major activities that day (work, screen time, exercise). Baseline:  Goal status: INITIAL   LONG TERM GOALS: Target date: 04/06/2024  Pt will review symptom management log weekly to track patterns and adjust pace accordingly Baseline:  Goal status: INITIAL  2.  Pt will maintain balanced daily routine: sleep hygiene, moderate activity, cognitive pacing, gradual exposure to stress/work demands. Baseline:  Goal status: INITIAL   ASSESSMENT:  CLINICAL IMPRESSION: Patient is a 28 y.o. male who was seen today for a cognitive communication evaluation d/t post concussion syndrome. Pt presents with cognitive impairments in the areas of: initiation, working memory, planning, organizing, and/or the ability to monitor task-oriented problem solving.  OBJECTIVE IMPAIRMENTS include attention, memory, awareness, and executive  functioning. These impairments are limiting patient from return to work, managing medications, managing appointments, managing finances, household responsibilities, ADLs/IADLs, and effectively communicating at home and in community. Factors affecting potential to achieve goals and functional outcome are N/A. Patient will benefit from skilled SLP services to address above impairments and improve overall function.  REHAB POTENTIAL: Good  PLAN: SLP FREQUENCY: 1-2x/week  SLP DURATION: 8 weeks  PLANNED INTERVENTIONS: Cueing hierachy, Cognitive reorganization, Internal/external aids, Functional tasks, SLP instruction and feedback, Compensatory strategies, and Patient/family education   Kobey Sides B. Rubbie, M.S., CCC-SLP, Tree Surgeon Certified Brain Injury Specialist [facility]  [facility] Office [phone] Ascom [phone] Fax [phone]

## 2024-02-12 ENCOUNTER — Ambulatory Visit: Payer: Worker's Compensation

## 2024-02-12 ENCOUNTER — Ambulatory Visit: Payer: Worker's Compensation | Admitting: Speech Pathology

## 2024-02-12 VITALS — BP 129/95 | HR 84

## 2024-02-12 DIAGNOSIS — R42 Dizziness and giddiness: Secondary | ICD-10-CM

## 2024-02-12 DIAGNOSIS — F0781 Postconcussional syndrome: Secondary | ICD-10-CM

## 2024-02-12 DIAGNOSIS — R41841 Cognitive communication deficit: Secondary | ICD-10-CM

## 2024-02-12 DIAGNOSIS — S069XAS Unspecified intracranial injury with loss of consciousness status unknown, sequela: Secondary | ICD-10-CM

## 2024-02-12 NOTE — Addendum Note (Signed)
 Addended by: RUBBIE PACIFIC B on: 02/12/2024 08:00 AM   Modules accepted: Orders

## 2024-02-12 NOTE — Therapy (Signed)
 OUTPATIENT PHYSICAL THERAPY TREATMENT  Patient Name: Kyle Tran MRN: [id number] DOB:09-Apr-1995, 28 y.o., male Today's Date: 02/12/2024  PCP: Kyle Tran ORN, NP  REFERRING PROVIDER: Babs Arthea ONEIDA, MD  END OF SESSION:  PT End of Session - 02/12/24 1935     Visit Number 2    Number of Visits 16    Date for Recertification  03/30/24    Authorization Type Workers Comp: Authorized for 12 visits    Authorization Time Period 02/03/24-03/30/24    Authorization - Visit Number 2    Authorization - Number of Visits 12    Progress Note Due on Visit 10    PT Start Time 1615    PT Stop Time 1655    PT Time Calculation (min) 40 min    Activity Tolerance Patient tolerated treatment well;No increased pain    Behavior During Therapy [facility] for tasks assessed/performed          Past Medical History:  Diagnosis Date   Concussion 10/2023   Past Surgical History:  Procedure Laterality Date   ADENOIDECTOMY     age 45   EXTERNAL EAR SURGERY     HYDROCELE EXCISION / REPAIR     age 46   Patient Active Problem List   Diagnosis Date Noted   Post concussion syndrome 01/21/2024   BPPV (benign paroxysmal positional vertigo), left 01/21/2024   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   Sleep disorder 01/21/2024    ONSET DATE: August 2025  REFERRING DIAG: Post concussion syndrome, vertigo, diplopia   THERAPY DIAG:  Post concussion syndrome  Dizziness and giddiness  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 He reports ability to perform his gaze stabilization is quite poor when his HA symptoms are bad. He  also says laying with his left head on pillow results in left sided throbbing pain sensation. No return to gym for treadmill walking this week.   PAIN:  7/10 across top, front, center of head, bilat and central, non-throbbing.   NAUSEA:  0/10  PERTINENT HISTORY:   Kyle Tran is a 28yoM referred to OPPT vestibular rehab by physiatry following a head injury at work. Pt reports rising to full upright stance during a mechanical task and hitting the top/back of his  head on a hydraulic lift arm. Pt denies LOC. Pt reports initially being very limited by vertigo, dizziness, HA which resolved over days to weeks. In most recent weeks prior to evaluation here, pt denies neck pain, but endorses vertigo/e triggers with repeated left head turning and leftward gaze, he relates to needs for highway driving. Pt is still very much unable to tolerate most head movements for driving, but is tolerant as a front seat passenger. Pt reports 2 other primary triggers as flashing and/or bright lights which can result trigger migraine aura pt seeing bright flashing lights not present, and loud noises can trigger brief, loud Left sided tinnitus several times daily, typically 20sec duration. Pt has been going to the gym with mom 2-3x weekly, mostly treadmill walking, sometimes resistance training, trying to find a degree of exertion that does not trigger migraine symptoms. Pt reports significant sleep disruption overall, continues to attempt a regular sleep schedule as recommended by MD, but has responded unfavorably to medications to help with sleep. EMR review indication pt being followed in 2023 for chronic HA + fatigue+ memory concerns.   EMR review shows prior head injury in 2021- he had a striking head injury and that in the past six months he feels his short term memory has diminished, particularly while working. Pt pursued laceration repair at [facility] ED 07/2019 after striking his head on corner of car at work, no loss of  consciousness reported at that time and no indication for imaging. He followed up with [facility] 0611/21 for this issue, head x-ray at that time was negative and pt was diagnosed with post-traumatic headache. 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   Established with neurology Dr. Maree in 2023- recommended sleep study to evaluate OSA, recommended being established with opthalmolog, brain MRI reassuring, referral to neuropsychology for further cognitive assessment, vitamin D supplementation. Does not appear pt followed up with neurology after July 2023, despite recommendations.  PAIN PATTERN AT EVALULATION:  Are you having pain? 6/10 headache at present, elevated since waking today ,non throbbing, in blue distribution. Pt has HA daily, lowest severity in past week 3/10. Patient describes orange distribution as pattern of HA when having migraine, occurs daily.       PRECAUTIONS: photophobia, phonophobia   WEIGHT BEARING RESTRICTIONS: None  FALLS: Has patient fallen in last 6 months? No  LIVING ENVIRONMENT: Lives with: Mother (pt in between roommates/apartments)  *mom assists with transportation as of evaluation due to post concussion intolerance to driving  PLOF: works full time as a curator at [employer], [employer]video in [place]   PATIENT GOALS: Return to work, return to unrestricted exercise and unrestricted working on his own cars.  OBJECTIVE:  Note: Objective measures were completed at evaluation unless otherwise noted.  DIAGNOSTIC FINDINGS:   HeadCT in 2021: reassuring findings  Brain MRI: 2023: unremarkable  COGNITION: Overall cognitive status: Not evaluated by provider; pt has a referral to speech therapy for high-level cognitive screening. *see SLP note for detail.    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  *pt was seen in UC in 2023 with heart palpitations, sense of missing beats, EKG normal at that time, no definitive workup.      Vitals   02/12/24 1630  BP: (!) 129/95  Pulse: 84  SpO2: 99%   There is no height or weight on file to calculate BMI.     Oculomotor Screening: 02/03/24   Gaze fixation Continuous twitching without any clear pattern  horizontal pursuits WNL  Rt/Lt  gaze holding WNL, nystagmus free  vertical pursuits WNL  up/down gaze holding WNL  horizontal saccades   intermittent small  undershooting  vergence  16cm   Horizontal VOR WNL  Vision suppressed:   neutral gaze, spontaneous nystagmus Deferred to visit 2  Rt/Lt  gaze holding Deferred to visit 2  up/down gaze holding Deferred to visit 2   VESTIBULAR SCREENING: Cervical rotation ROM:  Right: 75 degrees, no pain or symptoms   Left: 65 degrees, makes left eye feel 'funny', no neck pain, no dizziness  Cross cover test:  -negative  Head Impulse Test: -Right: negative   -Left: negative   CANAL TESTING: -not indicated as of time of visit 2: pt denies transient vertigo, dizziness, N/V, or vision phenomenon with rolling in bed, moving from sitting EOPB to/from supine.    GAIT: -Pt has been AMB on treadmill at gym prior to evlauation, up to 1 mile at 3.0 mph, often has onset migraine symptoms immediately following activity, and is trying to decrease to less triggering amount of time.   -in clinic pt AMB 456ft overground, no device, no LOB, no trouble with various lighting types except when glancing and looking directly into LED overhead light fixture.  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 Left temporalis: painful, unfamiliar  4. Left upper trapezius, near [facility] joint: painful, unfamiliar  5. Left upper trapezius, mid belly: painful, unfamiliar  6. Left splenius capitus, mid belly: painful, congruent with HA symptoms  A. Resolved with sustained ischemic release  7. Left suboccipital group, lateral: congruent with HA symptoms   A. Resolved with sustained ischemic release 8. Left suboccipital group, superior: congruent with HA symptoms A. Resolved with sustained ischemic release 9. Left Levator Palpebrae Superioris: tenderness, familiar symptoms  A. Defer release to later session.   PATIENT SURVEYS:  Dizziness Handicap Inventory: 52% (02/12/24)  Post Concussion Symptoms Score: 48.5% (02/12/24)                                                                                                                               TREATMENT DATE 02/03/24:   -reviewed HEP performance, best practices  -vitals assessment, discussion/education about pulsus alternans concerns: pleth wave irregularity less convincing today, no abnormal pulses noted with manual vitals assessment, Pt reports he declined FU with PCP office when they  called.  -overground AMB 931ft, 66m44s, 1.34m/s, no device, no LOB, no symptoms exacerbation -manual BP and pleth reassessment, still remain WNL -Myofascial release of left sided neck muscles with referral pain congruent with HA symptoms: Left sided splenius capitus, Left SCM, Left suboccipital group superior and lateral.  -HA reduced to 4/10 by end of session (from 7/10)  -Education on Left suboccipital stretch at home, 1x60sec end-range cervical flexion with Left rotation added  PATIENT EDUCATION: Education details: See above Person  educated: Cam  Education method: research scientist (medical), deliberate practice, positive reinforcement, explicit instruction, establish rules. Education comprehension: Good   HOME EXERCISE PROGRAM: Access Code: [email or web address] URL: [email or web address] Date: 02/12/2024 Prepared by: Peggye Linear  Exercises - Seated Gaze Stabilization with Head Rotation  - 2-3 x daily - 7 x weekly - 3 reps - 30sec hold - Pencil Pushups  - 2-3 x daily - 7 x weekly - 2 sets - 15 reps - 3sec hold - End range flexion, then add Left rotation  - 2 x daily - 7 x weekly - 2 reps - 60 hold   GOALS: Goals reviewed with patient? No  SHORT TERM GOALS: Target date: 03/04/24  Pt to demonstrate 15x left head turns in <90sec without increased vertigo or migraine.  Baseline: Goal status: INITIAL  2.  Pt to report tolerance to daily walking moderate intensity >15 minutes to promote brain recovery and general wellness.  Baseline:  Goal status: INITIAL  3.  Pt to report regular performance and compliance with HEP for visual accomodation training.  Baseline:  Goal status: INITIAL  4. Pt to report tolerance to driving >89 minutes without increased migraine or dizziness symptoms.   Baseline:   Goal Status: INITIAL  LONG TERM GOALS: Target date: 03/30/24  Pt to show >25% improvement on Post Concussion Symptom Scale (PCSS)  Baseline: PCSS 48.5% 02/12/24 Goal status: INITIAL  2.  Pt to report tolerance of AMB >37min 3x/week or greater without increased migraine or vertigo to indicate progress toward readiness for return to IADL.  Baseline:  Goal status: INITIAL  3.  Pt to report ability to perform moderate intensity resistance exercise >83min, 2-3x/week or greater to show improved readiness for return to working on his cars and perform heavy home care activity.  Baseline:  Goal status: INITIAL  4.  Pt to report ability to tolerance driving >59 minutes without increased dizziness or migraine to allow for  return to driving to work.  Baseline:  Goal status: INITIAL   ASSESSMENT:  CLINICAL IMPRESSION: 28yoM referred to OPPT after head injury and ongoing post concussion dizziness, vertigo, migraine. Examination revealing of phonophobia, photophobia, symptom intolerance to moderate resistance activity and repeated head movements, impaired oculomotor function in testing, assessment of visual system somewhat triggering to dizziness. Sleep and exercise both remain very much disrupted and off baseline. Pt started on visual accomodation training exercises for clinic and home, Left neck stretch added on visit 2. Vitals assessment on visit 1 concerning for possible pulsus alternans pleth wave patterns, this information relayed to PCP. Dizziness handicap inventory and post concussion symptoms score both indicate significant disability. There has been no indication to perform canal testing at up to this point. Left sided face and neck muscles found to be contributing to cervicogenic HA on visit 2, reduced pain after treatment in session. Patient will benefit from skilled physical therapy intervention to reduce deficits and impairments identified in evaluation, in order to reduce pain, improve quality of life, and maximize activity tolerance for ADL,  IADL, and leisure/fitness. Physical therapy will help pt achieve long and short term goals of care.    OBJECTIVE IMPAIRMENTS: Decreased knowledge of condition, decreased use of DME, decreased mobility, difficulty walking, decreased strength, decreased ROM. ACTIVITY LIMITATIONS: Lifting, standing, walking, squatting, transfers, locomotion level PARTICIPATION LIMITATIONS: Cleaning, laundry, interpersonal relationships, driving, yardwork, community activity.  PERSONAL FACTORS: Age, behavior pattern, education, past/current experiences, transportation, profession  are also affecting patient's functional outcome.  REHAB POTENTIAL: Good CLINICAL DECISION MAKING: Medium   EVALUATION COMPLEXITY: Moderate    PLAN:  PT FREQUENCY: 1x/week PT DURATION: 12 weeks  PLANNED INTERVENTIONS: 97110-Therapeutic exercises, 97530- Therapeutic activity, 97112- Neuromuscular re-education, 97535- Self Care, 02859- Manual therapy, [phone]- Gait training, [phone]- Electrical stimulation (unattended), [phone]- Electrical stimulation (manual), Patient/Family education, Balance training, Stair training, Vestibular training, Visual/preceptual remediation/compensation, Cognitive remediation, Cryotherapy, and Moist heat  PLAN FOR NEXT SESSION:  -overground AMB increase, self selected pace -cervicogenic mysofascial release for HA  -teach VOR cancelation exercise for clinic/home -monitor pleth waveform after exercise   7:37 PM, 02/12/24 Peggye JAYSON Linear, PT, DPT Physical Therapist - [facility]  Outpatient Physical Therapy- Main [facility] [phone]     Lakeland C, PT 02/12/2024, 7:37 PM

## 2024-02-13 NOTE — Therapy (Signed)
 OUTPATIENT SPEECH LANGUAGE PATHOLOGY  COGNITION TREATMENT NOTE   Patient Name: Kyle Tran MRN: [id number] DOB:[birth year], 28 y.o., male Today's Date: 02/13/2024  PCP: Levorn Snide, NP REFERRING PROVIDER: Arthea Gunther, MD   End of Session - 02/12/24 1617     Visit Number 2    Number of Visits 17    Date for Recertification  04/06/24    Authorization Type Generic Worker's Comp    Authorization - Visit Number 2    Authorization - Number of Visits 12    Progress Note Due on Visit 10    SLP Start Time 1530    SLP Stop Time  1615    SLP Time Calculation (min) 45 min    Activity Tolerance Patient tolerated treatment well          Past Medical History:  Diagnosis Date   Concussion 10/2023   Past Surgical History:  Procedure Laterality Date   ADENOIDECTOMY     age 22   EXTERNAL EAR SURGERY     HYDROCELE EXCISION / REPAIR     age 53   Patient Active Problem List   Diagnosis Date Noted   Post concussion syndrome 01/21/2024   BPPV (benign paroxysmal positional vertigo), left 01/21/2024   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     PAIN:  Are you having pain? Level 6 headache, monitored during session   FALLS: Has patient fallen in last 6 months?  See PT evaluation for details  LIVING ENVIRONMENT: Lives with: lives with their family Lives in: House/apartment  PLOF:  Level of assistance: Independent with ADLs, Independent with IADLs Employment: Full-time employment   PATIENT GOALS   to improve cognitive communication abilities  SUBJECTIVE STATEMENT: Pt eager, pleasant - states that they bought  a calendar, reports difficulty sleeping last night, increased headache over previous session for entire day, accompanied by his mother Pt accompanied by: self, his mother  OBJECTIVE:   TODAY'S TREATMENT:  Skilled treatment session targeted pt's cognitive communication goals. SLP facilitated session by providing the following interventions:  The Repeatable Battery for the Assessment of Neuropsychological Status Update (RBANS-Update) is a brief, individually administered test measuring attention, language, visuospatial/constructional abilities, and immediate and delayed memory. The test comprises 12 subtests that are intended for use with adolescents to adults, ages 39 to 3 years. Normative scores were developed using a stratified, nationally representative sample of 690 healthy adolescents and adults. RBANS yields index standard scores based on subtest raw scores. RBANS index scores are metrically scaled, with a mean of 100 and a standard deviation of 15 for each age group.    Immediate Memory Index Score: 94: Average (Index Score 90-109)  Total Domain Score List Learning 28/40 Story Memory 20/24  Visuospatial/Constructional Index Score: 112: High Average (Index Score 110-119)  Total Domain Score Figure Copy 20/20 Line Orientation 19/20   Language Index Score: 85: Low Average (Index Score 80-89)  Total Domain Score Picture Naming 10/10 Semantic Fluency 16/40   Attention Index Score: 128: Superior (Index Score 120-129)  Total Domain Score Digit Span 12/16  Coding 89/89   Delayed Memory Index Score: 84: Low Average (Index Score 80-89)  Total  Domain Score List Recall 6/10 List Recognition 18/20  Story Recall 12/12 Figure Recall 20/20   Total Scale is 100.   Education provided on ST POC with skilled written and verbal education provided on using external memory aid (calendar) to track triggers, symptoms, increase physical activity for 5 minutes, complete tasks for 10 minutes  with scheduled rest breaks.    PATIENT EDUCATION: Education details: see above Person educated: Patient and his mother Education method: Explanation Education comprehension: verbalized understanding and needs further education   HOME EXERCISE PROGRAM:   As above   GOALS:  Goals reviewed with patient? Yes  SHORT TERM GOALS: Target date: 10 sessions  Pt will take a 10-minute walk once a day (preferably in late morning or early afternoon), and if tolerated (no worsening of symptoms), increase to two walks per day for 10 minutes each, for 2 weeks Baseline: Goal status: INITIAL  2.  Each day, pt will do a maximum of 10 minutes of reading or low-demand computer/phone use, followed by a 10-minute break (timer-based), for 5 days this week Baseline:  Goal status: INITIAL  3.  Pt will use a simple daily planner starting tomorrow,  write down 5 tasks (can include rest, chores, self-care, light activity) each morning, and mark when completed. Do this daily for 4 weeks. Baseline:  Goal status: INITIAL  4.  Pt will keep a simple symptom/energy log each evening for 30 days: note headache, dizziness, fatigue, mood on 0-10 scale; note major activities that day (work, screen time, exercise). Baseline:  Goal status: INITIAL   LONG TERM GOALS: Target date: 04/06/2024  Pt will review symptom management log weekly to track patterns and adjust pace accordingly Baseline:  Goal status: INITIAL  2.  Pt will maintain balanced daily routine: sleep hygiene, moderate activity, cognitive pacing, gradual exposure to stress/work demands. Baseline:  Goal status: INITIAL   ASSESSMENT:  CLINICAL IMPRESSION: Patient is a 28 y.o. male who was seen today for a cognitive communication evaluation d/t post concussion syndrome. Pt presents with cognitive impairments in the areas of: initiation, working memory, planning, organizing, and/or the ability to monitor task-oriented problem solving.  Pt continues  to be eager to implement strategies. See the above treatment note for details.   OBJECTIVE IMPAIRMENTS include attention, memory, awareness, and executive functioning. These impairments are limiting patient from return to work, managing medications, managing appointments, managing finances, household responsibilities, ADLs/IADLs, and effectively communicating at home and in community. Factors affecting potential to achieve goals and functional outcome are N/A. Patient will benefit from skilled SLP services to address above impairments and improve overall function.  REHAB POTENTIAL: Good  PLAN: SLP FREQUENCY: 1-2x/week  SLP DURATION: 8 weeks  PLANNED INTERVENTIONS: Cueing hierachy, Cognitive reorganization, Internal/external aids, Functional tasks, SLP instruction and feedback, Compensatory strategies, and Patient/family education   Lecil Tapp B. Rubbie, M.S., CCC-SLP, Tree Surgeon Certified Brain Injury Specialist [facility]  [facility] Rehabilitation Services Office [phone] Ascom [phone] Fax [phone]

## 2024-02-16 ENCOUNTER — Ambulatory Visit: Payer: Worker's Compensation | Admitting: Speech Pathology

## 2024-02-16 ENCOUNTER — Ambulatory Visit

## 2024-02-16 DIAGNOSIS — R41841 Cognitive communication deficit: Secondary | ICD-10-CM

## 2024-02-16 NOTE — Therapy (Signed)
 OUTPATIENT SPEECH LANGUAGE PATHOLOGY  COGNITION TREATMENT NOTE   Patient Name: Kyle Tran MRN: [id number] DOB:February 02, 1996, 28 y.o., male Today's Date: 02/16/2024  PCP: Levorn Snide, NP REFERRING PROVIDER: Arthea Gunther, MD   End of Session - 02/12/24 1617     Visit Number 2    Number of Visits 17    Date for Recertification  04/06/24    Authorization Type Generic Worker's Comp    Authorization - Visit Number 2    Authorization - Number of Visits 12    Progress Note Due on Visit 10    SLP Start Time 1530    SLP Stop Time  1615    SLP Time Calculation (min) 45 min    Activity Tolerance Patient tolerated treatment well          Past Medical History:  Diagnosis Date   Concussion 10/2023   Past Surgical History:  Procedure Laterality Date   ADENOIDECTOMY     age 19   EXTERNAL EAR SURGERY     HYDROCELE EXCISION / REPAIR     age 40   Patient Active Problem List   Diagnosis Date Noted   Post concussion syndrome 01/21/2024   BPPV (benign paroxysmal positional vertigo), left 01/21/2024   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     PAIN:  Are you having pain? Level 6 headache, monitored during session   FALLS: Has patient fallen in last 6 months?  See PT evaluation for details  LIVING ENVIRONMENT: Lives with: lives with their family Lives in: House/apartment  PLOF:  Level of assistance: Independent with ADLs, Independent with IADLs Employment: Full-time employment   PATIENT GOALS   to improve cognitive communication abilities  SUBJECTIVE STATEMENT: Pt eager, pleasant -  Pt accompanied by: self  OBJECTIVE:   TODAY'S  TREATMENT:  Skilled treatment session targeted pt's cognitive communication goals. SLP facilitated session by providing the following interventions:  This clinical research associate reviewed the results of RBANS with pt. He reports good success with performing an activity for 10 minutes with 10 minutes rest - he reports things seem to go better doing it that way. He brought in calendar with recent activities written down, including social event and walking.   Pt able to work for 30 minutes and then provided with rest break. Pt engaged in semi-complex monthly calendar and complex daily scheduling independently with 100% accuracy.   To facilitated work-place similar deductive reasoning, pt completed semi-complex deductive reasoning puzzles with 100% accuracy. Great Job, great improvement.   PATIENT EDUCATION: Education details: see above Person educated: Patient  Education method: Explanation Education comprehension: verbalized understanding and needs further education   HOME EXERCISE PROGRAM:   As above   GOALS:  Goals reviewed with patient? Yes  SHORT TERM GOALS: Target date: 10 sessions  Pt will take a 10-minute walk once a day (preferably in late morning or early afternoon), and if tolerated (no worsening of symptoms), increase to two walks per day for 10 minutes each, for 2 weeks Baseline: Goal status: INITIAL  2.  Each day, pt will do a maximum of 10 minutes of reading or low-demand computer/phone use, followed by a 10-minute break (timer-based), for 5 days  this week Baseline:  Goal status: INITIAL  3.  Pt will use a simple daily planner starting tomorrow,  write down 5 tasks (can include rest, chores, self-care, light activity) each morning, and mark when completed. Do this daily for 4 weeks. Baseline:  Goal status: INITIAL  4.  Pt will keep a simple symptom/energy log each evening for 30 days: note headache, dizziness, fatigue, mood on 0-10 scale; note major activities that day (work,  screen time, exercise). Baseline:  Goal status: INITIAL   LONG TERM GOALS: Target date: 04/06/2024  Pt will review symptom management log weekly to track patterns and adjust pace accordingly Baseline:  Goal status: INITIAL  2.  Pt will maintain balanced daily routine: sleep hygiene, moderate activity, cognitive pacing, gradual exposure to stress/work demands. Baseline:  Goal status: INITIAL   ASSESSMENT:  CLINICAL IMPRESSION: Patient is a 28 y.o. male who was seen today for a cognitive communication evaluation d/t post concussion syndrome. Pt presents with cognitive impairments in the areas of: initiation, working memory, planning, organizing, and/or the ability to monitor task-oriented problem solving.  Pt continues to be eager to implement strategies. See the above treatment note for details.   OBJECTIVE IMPAIRMENTS include attention, memory, awareness, and executive functioning. These impairments are limiting patient from return to work, managing medications, managing appointments, managing finances, household responsibilities, ADLs/IADLs, and effectively communicating at home and in community. Factors affecting potential to achieve goals and functional outcome are N/A. Patient will benefit from skilled SLP services to address above impairments and improve overall function.  REHAB POTENTIAL: Good  PLAN: SLP FREQUENCY: 1-2x/week  SLP DURATION: 8 weeks  PLANNED INTERVENTIONS: Cueing hierachy, Cognitive reorganization, Internal/external aids, Functional tasks, SLP instruction and feedback, Compensatory strategies, and Patient/family education   Antuan Limes B. Rubbie, M.S., CCC-SLP, Tree Surgeon Certified Brain Injury Specialist [facility]  [facility] Rehabilitation Services Office [phone] Ascom [phone] Fax [phone]

## 2024-02-17 ENCOUNTER — Ambulatory Visit

## 2024-02-19 ENCOUNTER — Ambulatory Visit: Payer: Worker's Compensation | Admitting: Speech Pathology

## 2024-02-19 ENCOUNTER — Ambulatory Visit: Payer: Worker's Compensation

## 2024-02-19 ENCOUNTER — Ambulatory Visit

## 2024-02-19 DIAGNOSIS — R42 Dizziness and giddiness: Secondary | ICD-10-CM

## 2024-02-19 DIAGNOSIS — R41841 Cognitive communication deficit: Secondary | ICD-10-CM | POA: Diagnosis not present

## 2024-02-19 DIAGNOSIS — G44309 Post-traumatic headache, unspecified, not intractable: Secondary | ICD-10-CM

## 2024-02-19 DIAGNOSIS — F0781 Postconcussional syndrome: Secondary | ICD-10-CM

## 2024-02-19 NOTE — Therapy (Signed)
 OUTPATIENT SPEECH LANGUAGE PATHOLOGY  COGNITION TREATMENT NOTE   Patient Name: Kyle Tran MRN: [id number] DOB:December 16, 1995, 27 y.o., male Today's Date: 02/19/2024  PCP: Levorn Snide, NP REFERRING PROVIDER: Arthea Gunther, MD   End of Session - 02/12/24 1617     Visit Number 2    Number of Visits 17    Date for Recertification  04/06/24    Authorization Type Generic Worker's Comp    Authorization - Visit Number 2    Authorization - Number of Visits 12    Progress Note Due on Visit 10    SLP Start Time 1530    SLP Stop Time  1615    SLP Time Calculation (min) 45 min    Activity Tolerance Patient tolerated treatment well          Past Medical History:  Diagnosis Date   Concussion 10/2023   Past Surgical History:  Procedure Laterality Date   ADENOIDECTOMY     age 51   EXTERNAL EAR SURGERY     HYDROCELE EXCISION / REPAIR     age 67   Patient Active Problem List   Diagnosis Date Noted   Post concussion syndrome 01/21/2024   BPPV (benign paroxysmal positional vertigo), left 01/21/2024   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     PAIN:  Are you having pain? Level 6 headache, monitored during session   FALLS: Has patient fallen in last 6 months?  See PT evaluation for details  LIVING ENVIRONMENT: Lives with: lives with their family Lives in: House/apartment  PLOF:  Level of assistance: Independent with ADLs, Independent with IADLs Employment: Full-time employment   PATIENT GOALS   to improve cognitive communication abilities  SUBJECTIVE STATEMENT: Pt eager, pleasant -  Pt accompanied by: self  OBJECTIVE:   TODAY'S  TREATMENT:  Skilled treatment session targeted pt's cognitive communication goals. SLP facilitated session by providing the following interventions:  Pt reports increased ability to participate in tasks throughout his day. He reports consistent headache of 4 out of 20 that is not increased with activities in the session.   He reports increased sensitivity to increased sunshine outside (last several weeks have ben overcast and cloudy).   In addition, pt reports decreased sleep last night (I didn't get any sleep last night because my day kept stealing my blanket and I woke up cold.  Pt able to participate in 45 minutes of cognitive activities in session.  Pt completed semi-complex deductive reasoning tasks with rare supervision cues and good completion.   PATIENT EDUCATION: Education details: see above Person educated: Patient  Education method: Explanation Education comprehension: verbalized understanding and needs further education   HOME EXERCISE PROGRAM:   As above   GOALS:  Goals reviewed with patient? Yes  SHORT TERM GOALS: Target date: 10 sessions  Pt will take a 10-minute walk once a day (preferably in late morning or early afternoon), and if tolerated (no worsening of symptoms), increase to two walks per day for 10 minutes each, for 2 weeks Baseline: Goal status: INITIAL  2.  Each day, pt will do a maximum of 10 minutes of reading or low-demand computer/phone use, followed by a 10-minute break (timer-based),  for 5 days this week Baseline:  Goal status: INITIAL  3.  Pt will use a simple daily planner starting tomorrow,  write down 5 tasks (can include rest, chores, self-care, light activity) each morning, and mark when completed. Do this daily for 4 weeks. Baseline:  Goal status: INITIAL  4.  Pt will keep a simple symptom/energy log each evening for 30 days: note headache, dizziness, fatigue, mood on 0-10 scale; note major activities that day (work, screen time,  exercise). Baseline:  Goal status: INITIAL   LONG TERM GOALS: Target date: 04/06/2024  Pt will review symptom management log weekly to track patterns and adjust pace accordingly Baseline:  Goal status: INITIAL  2.  Pt will maintain balanced daily routine: sleep hygiene, moderate activity, cognitive pacing, gradual exposure to stress/work demands. Baseline:  Goal status: INITIAL   ASSESSMENT:  CLINICAL IMPRESSION: Patient is a 28 y.o. male who was seen today for a cognitive communication evaluation d/t post concussion syndrome. Pt presents with cognitive impairments in the areas of: initiation, working memory, planning, organizing, and/or the ability to monitor task-oriented problem solving.  Pt continues to be eager to implement strategies. See the above treatment note for details.   OBJECTIVE IMPAIRMENTS include attention, memory, awareness, and executive functioning. These impairments are limiting patient from return to work, managing medications, managing appointments, managing finances, household responsibilities, ADLs/IADLs, and effectively communicating at home and in community. Factors affecting potential to achieve goals and functional outcome are N/A. Patient will benefit from skilled SLP services to address above impairments and improve overall function.  REHAB POTENTIAL: Good  PLAN: SLP FREQUENCY: 1-2x/week  SLP DURATION: 8 weeks  PLANNED INTERVENTIONS: Cueing hierachy, Cognitive reorganization, Internal/external aids, Functional tasks, SLP instruction and feedback, Compensatory strategies, and Patient/family education   Nayquan Evinger B. Rubbie, M.S., CCC-SLP, Tree Surgeon Certified Brain Injury Specialist [facility]  [facility] Rehabilitation Services Office [phone] Ascom [phone] Fax [phone]

## 2024-02-20 NOTE — Therapy (Addendum)
 OUTPATIENT PHYSICAL THERAPY TREATMENT  Patient Name: Kyle Tran MRN: [id number] DOB:1995-07-30, 28 y.o., male Today's Date: 02/20/2024  PCP: Geralene Levorn ORN, NP  REFERRING PROVIDER: Babs Arthea ONEIDA, MD  END OF SESSION:  PT End of Session - 02/19/24 1635     Visit Number 3    Number of Visits 16    Date for Recertification  03/30/24    Authorization Type Workers Comp: Authorized for 12 visits    Authorization Time Period 02/03/24-03/30/24    Authorization - Visit Number 3    Authorization - Number of Visits 12    Progress Note Due on Visit 10    PT Start Time 1620    PT Stop Time 1700    PT Time Calculation (min) 40 min    Activity Tolerance Patient tolerated treatment well;No increased pain    Behavior During Therapy [facility] for tasks assessed/performed          Past Medical History:  Diagnosis Date   Concussion 10/2023   Past Surgical History:  Procedure Laterality Date   ADENOIDECTOMY     age 13   EXTERNAL EAR SURGERY     HYDROCELE EXCISION / REPAIR     age 38   Patient Active Problem List   Diagnosis Date Noted   Post concussion syndrome 01/21/2024   BPPV (benign paroxysmal positional vertigo), left 01/21/2024   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 Pt reports near full relief of pain following manualr release to left splenius capitus last session. Pain is elevated from a high exertion day  yesterday. SLP asked him to schedule 5 minute walks on his calendar for organization.   PAIN:  4/10 left ocular headache  NAUSEA:  0/10  PERTINENT HISTORY:   Kyle Tran is a 28yoM referred to OPPT vestibular rehab by physiatry following a head injury at work. Pt reports rising to full upright stance during a mechanical task and hitting the top/back of his head on a hydraulic lift arm. Pt denies LOC. Pt reports initially being very limited by vertigo, dizziness, HA which  resolved over days to weeks. In most recent weeks prior to evaluation here, pt denies neck pain, but endorses vertigo/e triggers with repeated left head turning and leftward gaze, he relates to needs for highway driving. Pt is still very much unable to tolerate most head movements for driving, but is tolerant as a front seat passenger. Pt reports 2 other primary triggers as flashing and/or bright lights which can result trigger migraine aura pt seeing bright flashing lights not present, and loud noises can trigger brief, loud Left sided tinnitus several times daily, typically 20sec duration. Pt has been going to the gym with mom 2-3x weekly, mostly treadmill walking, sometimes resistance training, trying to find a degree of exertion that does not trigger migraine symptoms. Pt reports significant sleep disruption overall, continues to attempt a regular sleep schedule as recommended by MD, but has responded unfavorably to medications to help with sleep. EMR review indication pt being followed in 2023 for chronic HA + fatigue+ memory concerns.   EMR review shows prior head injury in 2021- he had a striking head injury and that in the past six months he feels his short term memory has diminished, particularly while working. Pt pursued laceration repair at [facility] ED 07/2019 after striking his head on corner of car at work, no loss of consciousness reported at that time and no indication for imaging. He followed up with [facility]  0611/21 for this issue, head x-ray at that time was negative and pt was diagnosed with post-traumatic headache. 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   Established with neurology Dr. Maree in 2023- recommended sleep study to evaluate OSA, recommended being established with opthalmolog, brain MRI reassuring, referral to neuropsychology for further cognitive assessment, vitamin D supplementation. Does not appear pt followed up with neurology after July 2023, despite recommendations.  PAIN PATTERN AT EVALULATION:  Are you having pain? 6/10 headache at present, elevated since waking today ,non throbbing, in blue distribution. Pt has HA daily, lowest severity in past week 3/10. Patient describes orange distribution as pattern of HA when having migraine, occurs daily.       PRECAUTIONS: photophobia, phonophobia   WEIGHT BEARING RESTRICTIONS: None  FALLS: Has patient fallen in last 6 months? 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  OBJECTIVE:  Note: Objective measures were completed at evaluation unless otherwise noted.  DIAGNOSTIC FINDINGS:   HeadCT in 2021: reassuring findings  Brain MRI: 2023: unremarkable  COGNITION: Overall cognitive status: Not evaluated by provider; pt has a referral to speech therapy for high-level cognitive screening. *see SLP note for detail.    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  *pt was seen in UC in 2023 with heart palpitations, sense of missing beats, EKG normal at that time, no definitive workup.      Vitals   02/12/24 1630  BP: (!) 129/95  Pulse: 84  SpO2: 99%   There is no height or weight on file to calculate BMI.  Oculomotor Screening: 02/03/24   Gaze fixation Continuous twitching without any clear pattern  horizontal pursuits WNL  Rt/Lt  gaze holding WNL, nystagmus free  vertical pursuits WNL  up/down gaze holding WNL  horizontal saccades   intermittent small  undershooting  vergence  16cm   Horizontal VOR WNL  Vision suppressed:   neutral gaze, spontaneous nystagmus Deferred to visit 2  Rt/Lt  gaze holding Deferred to visit 2  up/down gaze holding Deferred to visit 2   VESTIBULAR SCREENING: Cervical rotation ROM:  Right: 75 degrees, no pain or symptoms   Left: 65 degrees, makes left eye feel 'funny', no neck pain, no dizziness  Cross cover test:  -negative  Head Impulse Test: -Right: negative   -Left: negative   CANAL TESTING: -not indicated as of time of visit 2: pt denies transient vertigo, dizziness, N/V, or vision phenomenon with rolling in bed, moving from sitting EOPB to/from supine.    GAIT: -Pt has been AMB on treadmill at gym prior to evlauation, up to 1 mile at 3.0 mph, often has onset migraine symptoms immediately following activity, and is trying to decrease to less triggering amount of time.   -in clinic pt AMB 467ft overground, no device, no LOB, no trouble with various lighting types except when glancing and looking directly into LED overhead light fixture.  -gait speed congruent with age/activity level of population norms  -Post AMB vitals consistent with standing vitals taken preAMB; -pleth continues to show aberrant wave pattern consistent with pulsus alternans, message sent to PCP prior to visit 2    MYOFASCIAL SCREENING:  02/12/24 Left sternocleidomastoid, lower: painful, unfamiliar  Left  sternocleidomastoid, upper: painful, burning lateral pain referral to left temporal region of head Resolved with sustained ischemic release  3. Left temporalis: painful, unfamiliar  4. Left upper trapezius, near [facility] joint: painful, unfamiliar  5. Left upper trapezius, mid belly: painful, unfamiliar  6. Left splenius capitus, mid belly: painful, congruent with HA symptoms  A. Resolved with sustained ischemic release  7. Left suboccipital group, lateral: congruent with HA symptoms   A. Resolved with sustained ischemic release 8. Left suboccipital group, superior: congruent with HA symptoms A. Resolved with sustained ischemic release 9. Left Levator Palpebrae Superioris: tenderness, familiar symptoms  A. Defer release to later session.   PATIENT SURVEYS:  Dizziness Handicap Inventory: 52% (02/12/24)  Post Concussion Symptoms Score: 48.5% (02/12/24)                                                                                                                               TREATMENT DATE 02/03/24:   -129/31mmHg 99% SpO2 82% *pleth waveform remains consistent with consistent wit pulsus alternans  -112/73mmHg manual LUE, no abnormal pulse sounds consistent wit pulsus alternans, heart sounds WNL. *asked Larnell Plater, respiratory therapist for 2nd opinion on vitals, heart sounds, pulses, pleth   -heat applied to neck muscles -manual release to left splenius cervisus, capitus- noted resolution of  left ocular HA thereafter.   -stand gaze stabilization 2x30sec with horizontal head turns *noted motor coordination deficits of head/neck  -cervical extension ROM  x15 -cervical rotation A/ROM 2x15 bilat  *left continues to increase HA and diffifuclty with visual focus left  *subtle but apparent decrease in ocular velocity during head turn A/ROM: noted 3 or less saccades when turning right, but Left eye movements more characteristic of Left pursuits that begin just before head movement -IR video capture  of VOR, occulomotor function: noted decreased velocity of saccades   -Will update HEP for cervical ROM and text a link.    PATIENT EDUCATION: Education details: See above Person educated: Cam  Education method: collaborative learning, deliberate practice, positive reinforcement, explicit instruction, establish rules. Education comprehension: Good   HOME EXERCISE PROGRAM: Access Code: [email or web address] URL: [email or web address] Date: 02/20/2024 Prepared by: Peggye Linear d Exercises - Seated Gaze Stabilization with Head Rotation  - 2-3 x daily - 3 reps - 60sec hold - Pencil Pushups  - 2-3 x daily - 2 sets - 15 reps - 3sec hold - End range flexion, then add Left rotation  - 2-3 x daily - 2 reps - 60 hold - Seated Cervical Rotation AROM  - 2 x daily - 1 sets - 10 reps - 5sec hold - Seated Cervical Rotation AROM (Mirrored)  - 2 x daily - 1 sets - 10 reps - 5sec hold - Seated Cervical Flexion AROM  - 2 x daily - 1 sets - 10 reps - 10sec hold - Seated Cervical Extension AROM  - 2 x daily - 1 sets - 15 reps - Seated Horizontal Saccades  - 2 x daily - 2 sets - 30 reps - 2sec hold - VOR Cancellation  - 2 x daily - 1 sets - 30 reps  *updated after session, text link to patient   GOALS: Goals reviewed with patient? No  SHORT TERM GOALS: Target date: 03/04/24  Pt to demonstrate 15x left head turns in <90sec without increased vertigo or migraine.  Baseline: Goal status: INITIAL  2.  Pt to report tolerance to daily walking moderate intensity >15 minutes to promote brain recovery and general wellness.  Baseline:  Goal status: INITIAL  3.  Pt to report regular performance and compliance with HEP for visual accomodation training.  Baseline:  Goal status: INITIAL  4. Pt to report tolerance to driving >89 minutes without increased migraine or dizziness symptoms.   Baseline:   Goal Status: INITIAL  LONG TERM GOALS: Target date: 03/30/24  Pt to show >25% improvement on Post  Concussion Symptom Scale (PCSS)  Baseline: PCSS 48.5% 02/12/24 Goal status: INITIAL  2.  Pt to report tolerance of AMB >59min 3x/week or greater without increased migraine or vertigo to indicate progress toward readiness for return to IADL.  Baseline:  Goal status: INITIAL  3.  Pt to report ability to perform moderate intensity resistance exercise >33min, 2-3x/week or greater to show improved readiness for return to working on his cars and perform heavy home care activity.  Baseline:  Goal status: INITIAL  4.  Pt to report ability to tolerance driving >59 minutes without increased dizziness or migraine to allow for return to driving to work.  Baseline:  Goal status: INITIAL   ASSESSMENT:  CLINICAL IMPRESSION: Pt reports good pain relief following Left cervical extensor triggerpoint release both last and this session. Pt still having visual difficulty with left head turns, noted questionable decreased saccade speeds today Left > Rt- visual difficulty resolved  with longer sustained left gaze, despite increase in Left ocular headache. Pt showing improved gaze stabilization exercise tolerance, but does demonstrates motor control deficits in head movements. Suspect pt will need a great degree of cervical motor control training for symptoms resolution. Patient will benefit from skilled physical therapy intervention to reduce deficits and impairments identified in evaluation, in order to reduce pain, improve quality of life, and maximize activity tolerance for ADL, IADL, and leisure/fitness. Physical therapy will help pt achieve long and short term goals of care.    OBJECTIVE IMPAIRMENTS: Decreased knowledge of condition, decreased use of DME, decreased mobility, difficulty walking, decreased strength, decreased ROM. ACTIVITY LIMITATIONS: Lifting, standing, walking, squatting, transfers, locomotion level PARTICIPATION LIMITATIONS: Cleaning, laundry, interpersonal relationships, driving, yardwork,  community activity.  PERSONAL FACTORS: Age, behavior pattern, education, past/current experiences, transportation, profession  are also affecting patient's functional outcome.  REHAB POTENTIAL: Good CLINICAL DECISION MAKING: Medium  EVALUATION COMPLEXITY: Moderate    PLAN:  PT FREQUENCY: 1x/week PT DURATION: 12 weeks  PLANNED INTERVENTIONS: 97110-Therapeutic exercises, 97530- Therapeutic activity, 97112- Neuromuscular re-education, [phone]- Self Care, 02859- Manual therapy, [phone]- Gait training, [phone]- Electrical stimulation (unattended), [phone]- Electrical stimulation (manual), Patient/Family education, Balance training, Stair training, Vestibular training, Visual/preceptual remediation/compensation, Cognitive remediation, Cryotherapy, and Moist heat  PLAN FOR NEXT SESSION:  -overground AMB increase, self selected pace -cervicogenic mysofascial release for HA  -review recent HEP updates  7:58 AM, 02/20/2024 Peggye JAYSON Linear, PT, DPT Physical Therapist - [facility]  Outpatient Physical Therapy- Main Campus [phone]     Arvin C, PT 02/20/2024, 7:58 AM

## 2024-02-23 ENCOUNTER — Ambulatory Visit: Payer: Worker's Compensation

## 2024-02-23 ENCOUNTER — Ambulatory Visit: Payer: Worker's Compensation | Admitting: Speech Pathology

## 2024-02-23 DIAGNOSIS — F0781 Postconcussional syndrome: Secondary | ICD-10-CM

## 2024-02-23 DIAGNOSIS — R42 Dizziness and giddiness: Secondary | ICD-10-CM

## 2024-02-23 DIAGNOSIS — R41841 Cognitive communication deficit: Secondary | ICD-10-CM

## 2024-02-23 DIAGNOSIS — S069XAS Unspecified intracranial injury with loss of consciousness status unknown, sequela: Secondary | ICD-10-CM

## 2024-02-23 NOTE — Therapy (Signed)
 OUTPATIENT PHYSICAL THERAPY TREATMENT  Patient Name: Kyle Tran MRN: [id number] DOB:1995/06/06, 28 y.o., male Today's Date: 02/23/2024  PCP: Geralene Levorn ORN, NP  REFERRING PROVIDER: Babs Arthea ONEIDA, MD  END OF SESSION:  PT End of Session - 02/23/24 1629     Visit Number 4    Number of Visits 16    Date for Recertification  03/30/24    Authorization Type Workers Comp: Authorized for 12 visits    Authorization Time Period 02/03/24-03/30/24    Authorization - Visit Number 4    Authorization - Number of Visits 12    Progress Note Due on Visit 10    PT Start Time 1615    PT Stop Time 1655    PT Time Calculation (min) 40 min    Activity Tolerance Patient tolerated treatment well;No increased pain;Patient limited by pain    Behavior During Therapy [facility] for tasks assessed/performed          Past Medical History:  Diagnosis Date   Concussion 10/2023   Past Surgical History:  Procedure Laterality Date   ADENOIDECTOMY     age 28   EXTERNAL EAR SURGERY     HYDROCELE EXCISION / REPAIR     age 41   Patient Active Problem List   Diagnosis Date Noted   Post concussion syndrome 01/21/2024   BPPV (benign paroxysmal positional vertigo), left 01/21/2024   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 Pt had partial success with new HEP assignments texted Friday, but some difficulty overall simply  from no being well rested. Pt was able to perform some short distance driving 3 times since last session.   PAIN:  6-7/10 left ocular headache, top of head (see distribution map below)  NAUSEA:  0/10  PERTINENT HISTORY:   Kyle Tran is a 28yoM referred to OPPT vestibular rehab by physiatry following a head injury at work. Pt reports rising to full upright stance during a mechanical task and hitting the top/back of his head on a hydraulic lift arm. Pt denies LOC. Pt reports  initially being very limited by vertigo, dizziness, HA which resolved over days to weeks. In most recent weeks prior to evaluation here, pt denies neck pain, but endorses vertigo/e triggers with repeated left head turning and leftward gaze, he relates to needs for highway driving. Pt is still very much unable to tolerate most head movements for driving, but is tolerant as a front seat passenger. Pt reports 2 other primary triggers as flashing and/or bright lights which can result trigger migraine aura pt seeing bright flashing lights not present, and loud noises can trigger brief, loud Left sided tinnitus several times daily, typically 20sec duration. Pt has been going to the gym with mom 2-3x weekly, mostly treadmill walking, sometimes resistance training, trying to find a degree of exertion that does not trigger migraine symptoms. Pt reports significant sleep disruption overall, continues to attempt a regular sleep schedule as recommended by MD, but has responded unfavorably to medications to help with sleep. EMR review indication pt being followed in 2023 for chronic HA + fatigue+ memory concerns.   EMR review shows prior head injury in 2021- he had a striking head injury and that in the past six months he feels his short term memory has diminished, particularly while working. Pt pursued laceration repair at [facility] ED 07/2019 after striking his head on corner of car at work, no loss of consciousness reported at that time and no  indication for imaging. He followed up with [facility] 0611/21 for this issue, head x-ray at that time was negative and pt was diagnosed with post-traumatic headache. 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   Established with neurology Dr. Maree in 2023- recommended sleep study to evaluate OSA, recommended being established with opthalmolog, brain MRI reassuring, referral to neuropsychology for further cognitive assessment, vitamin D supplementation. Does not appear pt followed up with neurology after July 2023, despite recommendations.  PAIN PATTERN AT EVALULATION:  Are you having pain? 6/10 headache at present, elevated since waking today ,non throbbing, in blue distribution. Pt has HA daily, lowest severity in past week 3/10. Patient describes orange distribution as pattern of HA when having migraine, occurs daily.       PRECAUTIONS: photophobia, phonophobia   WEIGHT BEARING RESTRICTIONS: None  FALLS: Has patient fallen in last 6 months? No  LIVING ENVIRONMENT: Lives with: Mother (pt in between roommates/apartments)  *mom assists with transportation as of evaluation due to post concussion intolerance to driving  PLOF: works full time as a curator at [employer], [employer]/film/video in [place]   PATIENT GOALS: Return to work, return to unrestricted exercise and unrestricted working on his own cars.  OBJECTIVE:  Note: Objective measures were completed at evaluation unless otherwise noted.  DIAGNOSTIC FINDINGS:   HeadCT in 2021: reassuring findings  Brain MRI: 2023: unremarkable  COGNITION: Overall cognitive status: Not evaluated by provider; pt has a referral to speech therapy for high-level cognitive screening. *see SLP note for detail.    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  *pt was seen in UC in 2023 with heart palpitations, sense of missing beats, EKG normal at that time, no definitive workup.      Vitals   02/12/24 1630  BP: (!) 129/95  Pulse: 84  SpO2: 99%   There is no  height or weight on file to calculate BMI.     Oculomotor Screening: 02/03/24   Gaze fixation Continuous twitching without any clear pattern  horizontal pursuits WNL  Rt/Lt  gaze holding WNL, nystagmus free  vertical pursuits WNL  up/down gaze holding WNL  horizontal saccades   intermittent small  undershooting  vergence  16cm   Horizontal VOR WNL  Vision suppressed:   neutral gaze, spontaneous nystagmus Deferred to visit 2  Rt/Lt  gaze holding Deferred to visit 2  up/down gaze holding Deferred to visit 2   VESTIBULAR SCREENING: Cervical rotation ROM:  Right: 75 degrees, no pain or symptoms   Left: 65 degrees, makes left eye feel 'funny', no neck pain, no dizziness  Cross cover test:  -negative  Head Impulse Test: -Right: negative   -Left: negative   CANAL TESTING: -not indicated as of time of visit 2: pt denies transient vertigo, dizziness, N/V, or vision phenomenon with rolling in bed, moving from sitting EOPB to/from supine.    GAIT: -Pt has been AMB on treadmill at gym prior to evlauation, up to 1 mile at 3.0 mph, often has onset migraine symptoms immediately following activity, and is trying to decrease to less triggering amount of time.   -in clinic pt AMB 470ft overground, no device, no LOB, no trouble with various lighting types except when glancing and looking directly into LED overhead light fixture.  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 Left temporalis: painful, unfamiliar  4. Left upper trapezius, near [facility] joint: painful, unfamiliar  5. Left upper trapezius, mid belly: painful, unfamiliar  6. Left splenius capitus, mid belly: painful, congruent with HA symptoms  A. Resolved with sustained ischemic release  7. Left suboccipital group, lateral: congruent with HA symptoms   A. Resolved with sustained ischemic release 8. Left suboccipital group, superior: congruent with HA symptoms A. Resolved with sustained ischemic release 9. Left Levator Palpebrae Superioris: tenderness, familiar symptoms  A. Defer release to later session.   PATIENT SURVEYS:  Dizziness Handicap Inventory: 52% (02/12/24)  Post Concussion Symptoms Score: 48.5% (02/12/24)                                                                                                                               TREATMENT DATE 02/03/24:   -manual release to left splenius cervisus, capitus- less improvement to left eye and head symptoms headache today than prior 2 sessions, but symptoms much worse.  -cervical retraction into 2 pillows 2x15x5secH  Move to at EOB: -seated cervical rotation 1x10 x5sec H bilat *no increased pain/HA -  standing left rotation 1x5x5secH with eye focus on [employer] -cervical flexion 1x10x5secH (towel roll resistance)  -cervical extension ROM 1x15 standing  -horizontal head turns with gaze stabilization 2x45sec -standing 20* saccades practice  -standing vertical saccade practice 1x20 (more difficult, more alternate overshooting, undershooting)  -VOR cancellation with  BUE and thumbs x30 *education on fatigue on neck changing motor control of head and contributing to more difficulty of eye focus  PATIENT EDUCATION: Education details: See  above Person educated: Cam  Education method: collaborative learning, deliberate practice, positive reinforcement, explicit instruction, establish rules. Education comprehension: Good   HOME EXERCISE PROGRAM: Access Code: [email or web address] URL: [email or web address] Date: 02/20/2024 Prepared by: Peggye Linear d Exercises - Seated Gaze Stabilization with Head Rotation  - 2-3 x daily - 3 reps - 60sec hold - Pencil Pushups  - 2-3 x daily - 2 sets - 15 reps - 3sec hold - End range flexion, then add Left rotation  - 2-3 x daily - 2 reps - 60 hold - Seated Cervical Rotation AROM  - 2 x daily - 1 sets - 10 reps - 5sec hold - Seated Cervical Rotation AROM (Mirrored)  - 2 x daily - 1 sets - 10 reps - 5sec hold - Seated Cervical Flexion AROM  - 2 x daily - 1 sets - 10 reps - 10sec hold - Seated Cervical Extension AROM  - 2 x daily - 1 sets - 15 reps - Seated Horizontal Saccades  - 2 x daily - 2 sets - 30 reps - 2sec hold - VOR Cancellation  - 2 x daily - 1 sets - 30 reps  *Issued in paper on 02/23/24   GOALS: Goals reviewed with patient? No  SHORT TERM GOALS: Target date: 03/04/24  Pt to demonstrate 15x left head turns in <90sec without increased vertigo or migraine.  Baseline: Goal status: INITIAL  2.  Pt to report tolerance to daily walking moderate intensity >15 minutes to promote brain recovery and general wellness.  Baseline:  Goal status: INITIAL  3.  Pt to report regular performance and compliance with HEP for visual accomodation training.  Baseline:  Goal status: INITIAL  4. Pt to report tolerance to driving >89 minutes without increased migraine or dizziness symptoms.   Baseline:   Goal Status: INITIAL  LONG TERM GOALS: Target date: 03/30/24  Pt to show >25% improvement on Post Concussion Symptom Scale (PCSS)  Baseline: PCSS 48.5% 02/12/24 Goal status: INITIAL  2.  Pt to report tolerance of AMB >28min 3x/week or greater without increased migraine or vertigo to  indicate progress toward readiness for return to IADL.  Baseline:  Goal status: INITIAL  3.  Pt to report ability to perform moderate intensity resistance exercise >42min, 2-3x/week or greater to show improved readiness for return to working on his cars and perform heavy home care activity.  Baseline:  Goal status: INITIAL  4.  Pt to report ability to tolerance driving >59 minutes without increased dizziness or migraine to allow for return to driving to work.  Baseline:  Goal status: INITIAL   ASSESSMENT:  CLINICAL IMPRESSION: Pt more limited today from ongoing sleep difficulty. WE reviewed HEP updates that took place during and after last session, use of app after texting link was not without hiccups. Pt is less responsive to manual cervical release after last session in terms of pain relief, however he has no pain exacerbation with rotation exercises this date. HEP paper copy provided. Pt to return in 2 weeks after holiday  travel to see family. Patient will benefit from skilled physical therapy intervention to reduce deficits and impairments identified in evaluation, in order to reduce pain, improve quality of life, and maximize activity tolerance for ADL, IADL, and leisure/fitness. Physical therapy will help pt achieve long and short term goals of care.    OBJECTIVE IMPAIRMENTS: Decreased knowledge of condition, decreased use of DME, decreased mobility, difficulty walking, decreased strength, decreased ROM. ACTIVITY LIMITATIONS: Lifting, standing, walking, squatting, transfers, locomotion level PARTICIPATION LIMITATIONS: Cleaning, laundry, interpersonal relationships, driving, yardwork, community activity.  PERSONAL FACTORS: Age, behavior pattern, education, past/current experiences, transportation, profession  are also affecting patient's functional outcome.  REHAB POTENTIAL: Good CLINICAL DECISION MAKING: Medium  EVALUATION COMPLEXITY: Moderate    PLAN:  PT FREQUENCY: 1x/week PT  DURATION: 12 weeks  PLANNED INTERVENTIONS: 97110-Therapeutic exercises, 97530- Therapeutic activity, 97112- Neuromuscular re-education, [phone]- Self Care, 02859- Manual therapy, [phone]- Gait training, [phone]- Electrical stimulation (unattended), [phone]- Electrical stimulation (manual), Patient/Family education, Balance training, Stair training, Vestibular training, Visual/preceptual remediation/compensation, Cognitive remediation, Cryotherapy, and Moist heat  PLAN FOR NEXT SESSION:  -overground AMB increase, self selected pace -cervicogenic mysofascial release for HA  -review recent HEP updates  4:35 PM, 02/23/2024 Peggye JAYSON Linear, PT, DPT Physical Therapist - [facility]  Outpatient Physical Therapy- [facility] [phone]     Wardell C, PT 02/23/2024, 4:35 PM

## 2024-02-23 NOTE — Therapy (Signed)
 OUTPATIENT SPEECH LANGUAGE PATHOLOGY  COGNITION TREATMENT NOTE   Patient Name: Kyle Tran MRN: [id number] DOB:11-27-1995, 28 y.o., male Today's Date: 02/23/2024  PCP: Levorn Snide, NP REFERRING PROVIDER: Arthea Gunther, MD   End of Session - 02/23/24 1526     Visit Number 4    Number of Visits 17    Date for Recertification  04/06/24    Authorization Type Generic Worker's Comp    Authorization - Visit Number 5    Authorization - Number of Visits 12    Progress Note Due on Visit 10    SLP Start Time 1530    SLP Stop Time  1615    SLP Time Calculation (min) 45 min    Activity Tolerance Patient tolerated treatment well            Past Medical History:  Diagnosis Date   Concussion 10/2023   Past Surgical History:  Procedure Laterality Date   ADENOIDECTOMY     age 28   EXTERNAL EAR SURGERY     HYDROCELE EXCISION / REPAIR     age 28   Patient Active Problem List   Diagnosis Date Noted   Post concussion syndrome 01/21/2024   BPPV (benign paroxysmal positional vertigo), left 01/21/2024   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     PAIN:  Are you having pain? Level 6 headache, monitored during session   FALLS: Has patient fallen in last 6 months?  See PT evaluation for details  LIVING ENVIRONMENT: Lives with: lives with their family Lives in: House/apartment  PLOF:  Level of assistance: Independent with ADLs, Independent with IADLs Employment: Full-time employment   PATIENT GOALS   to improve cognitive communication abilities  SUBJECTIVE STATEMENT: Pt eager, pleasant - was sleeping in waiting room Pt accompanied  by: self  OBJECTIVE:   TODAY'S TREATMENT:  Skilled treatment session targeted pt's cognitive communication goals. SLP facilitated session by providing the following interventions:  Pt stated that he didn't feel well - I didn't sleep at all last night. He reports increased stress and that he didn't have any melatonin to help with his sleep last night.   Pt with increased delayed responses today, benefiting from repetition of most verbal information. He remains eager and brought in his completed HEP.   SLP facilitated session by providing working memory alternating attention. With rare Min A, pt able to complete with 85% accuracy.   PATIENT EDUCATION: Education details: see above Person educated: Patient  Education method: Explanation Education comprehension: verbalized understanding and needs further education   HOME EXERCISE PROGRAM:   As above   GOALS:  Goals reviewed with patient? Yes  SHORT TERM GOALS: Target date: 10 sessions  Pt will take a 10-minute walk once a day (preferably in late morning or early afternoon), and if tolerated (no worsening of symptoms), increase to two walks per day for 10 minutes each, for 2 weeks Baseline: Goal status: INITIAL  2.  Each day, pt will do a maximum of 10 minutes of reading or low-demand computer/phone use, followed by a 10-minute break (timer-based), for 5 days this week Baseline:  Goal status: INITIAL  3.  Pt will use a simple  daily planner starting tomorrow,  write down 5 tasks (can include rest, chores, self-care, light activity) each morning, and mark when completed. Do this daily for 4 weeks. Baseline:  Goal status: INITIAL  4.  Pt will keep a simple symptom/energy log each evening for 30 days: note headache, dizziness, fatigue, mood on 0-10 scale; note major activities that day (work, screen time, exercise). Baseline:  Goal status: INITIAL   LONG TERM GOALS: Target date: 04/06/2024  Pt will review symptom management  log weekly to track patterns and adjust pace accordingly Baseline:  Goal status: INITIAL  2.  Pt will maintain balanced daily routine: sleep hygiene, moderate activity, cognitive pacing, gradual exposure to stress/work demands. Baseline:  Goal status: INITIAL   ASSESSMENT:  CLINICAL IMPRESSION: Patient is a 28 y.o. male who was seen today for a cognitive communication evaluation d/t post concussion syndrome. Pt presents with cognitive impairments in the areas of: initiation, working memory, planning, organizing, and/or the ability to monitor task-oriented problem solving.  Pt continues to be eager to implement strategies. See the above treatment note for details.   OBJECTIVE IMPAIRMENTS include attention, memory, awareness, and executive functioning. These impairments are limiting patient from return to work, managing medications, managing appointments, managing finances, household responsibilities, ADLs/IADLs, and effectively communicating at home and in community. Factors affecting potential to achieve goals and functional outcome are N/A. Patient will benefit from skilled SLP services to address above impairments and improve overall function.  REHAB POTENTIAL: Good  PLAN: SLP FREQUENCY: 1-2x/week  SLP DURATION: 8 weeks  PLANNED INTERVENTIONS: Cueing hierachy, Cognitive reorganization, Internal/external aids, Functional tasks, SLP instruction and feedback, Compensatory strategies, and Patient/family education   Miley Lindon B. Rubbie, M.S., CCC-SLP, Tree Surgeon Certified Brain Injury Specialist [facility]  [facility] Rehabilitation Services Office [phone] Ascom [phone] Fax [phone]

## 2024-02-24 ENCOUNTER — Ambulatory Visit

## 2024-02-26 ENCOUNTER — Ambulatory Visit

## 2024-03-01 ENCOUNTER — Ambulatory Visit

## 2024-03-01 ENCOUNTER — Ambulatory Visit: Admitting: Speech Pathology

## 2024-03-09 ENCOUNTER — Ambulatory Visit: Payer: Worker's Compensation | Admitting: Speech Pathology

## 2024-03-09 DIAGNOSIS — R41841 Cognitive communication deficit: Secondary | ICD-10-CM | POA: Insufficient documentation

## 2024-03-09 NOTE — Therapy (Unsigned)
 " OUTPATIENT SPEECH LANGUAGE PATHOLOGY  COGNITION TREATMENT NOTE DISCHARGE SUMMARY   Patient Name: Kyle Tran MRN: [id number] DOB:16-Jun-1995, 28 y.o., male Today's Date: 03/09/2024  PCP: Levorn Snide, NP REFERRING PROVIDER: Arthea Gunther, MD   End of Session - 03/09/24 1530     Visit Number 5    Number of Visits 17    Date for Recertification  04/06/24    Authorization Type Generic Worker's Comp    Authorization - Visit Number 6    Authorization - Number of Visits 12    Progress Note Due on Visit 10    SLP Start Time 1530    SLP Stop Time  1615    SLP Time Calculation (min) 45 min    Activity Tolerance Patient tolerated treatment well            Past Medical History:  Diagnosis Date   Concussion 10/2023   Past Surgical History:  Procedure Laterality Date   ADENOIDECTOMY     age 71   EXTERNAL EAR SURGERY     HYDROCELE EXCISION / REPAIR     age 75   Patient Active Problem List   Diagnosis Date Noted   Post concussion syndrome 01/21/2024   BPPV (benign paroxysmal positional vertigo), left 01/21/2024   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     PAIN:  Are you having pain? Level 6 headache, monitored during session   FALLS: Has patient fallen in last 6 months?  See PT evaluation for details  LIVING ENVIRONMENT: Lives with: lives with their family Lives in: House/apartment  PLOF:  Level of assistance: Independent with ADLs, Independent with IADLs Employment: Full-time employment   PATIENT GOALS   to improve cognitive communication abilities  SUBJECTIVE STATEMENT: Pt eager, pleasant   Pt accompanied by:  self  OBJECTIVE:   TODAY'S TREATMENT:  Skilled treatment session targeted pt's cognitive communication goals. SLP facilitated session by providing the following interventions:  Pt is returning from an out of town trip to [place]  - pt able to independently navigate airports and ability to function well at family activities. Reports increased cooking and baking - he reports having to follow directions and that is going well. Other than dull continuous headache, light sensitivity and broken sleep pt voices concern with memory deficits. These concerns were explored (both formally and informally) with pt not demonstrating any memory deficits. Pt reports desire to be perfect so while his cognitive abilities are functional and he is independent he views any mistake as an impairment. Extensive education provided to pt and his mother on pt's  great cognitive abilities and that occasional/intermittent mistakes are not indicative of an impairment and don't impact pt's independence. Education also provided on ways to improve attention/recall/overall communication with his mother such as calling each other's name and gaining their attention prior to providing information, decreasing distractions when communicating.    PATIENT EDUCATION: Education details: see above Person educated: Patient and his mother Education method: Explanation Education comprehension: verbalized understanding and needs further education   HOME EXERCISE PROGRAM:   As above   GOALS:  Goals reviewed with patient? Yes  SHORT TERM GOALS: Target date: 10 sessions  Pt will take  a 10-minute walk once a day (preferably in late morning or early afternoon), and if tolerated (no worsening of symptoms), increase to two walks per day for 10 minutes each, for 2 weeks Baseline: Goal status: INITIAL: MET  2.  Each day, pt will do a maximum of 10 minutes of reading or low-demand computer/phone use, followed by a 10-minute break  (timer-based), for 5 days this week Baseline:  Goal status: INITIAL: MET- exceeded  3.  Pt will use a simple daily planner starting tomorrow,  write down 5 tasks (can include rest, chores, self-care, light activity) each morning, and mark when completed. Do this daily for 4 weeks. Baseline:  Goal status: INITIAL: MET  4.  Pt will keep a simple symptom/energy log each evening for 30 days: note headache, dizziness, fatigue, mood on 0-10 scale; note major activities that day (work, screen time, exercise). Baseline:  Goal status: INITIAL: MET   LONG TERM GOALS: Target date: 04/06/2024  Pt will review symptom management log weekly to track patterns and adjust pace accordingly Baseline:  Goal status: INITIAL: MET - intermittently using per choice  2.  Pt will maintain balanced daily routine: sleep hygiene, moderate activity, cognitive pacing, gradual exposure to stress/work demands. Baseline:  Goal status: INITIAL: MET   ASSESSMENT:  CLINICAL IMPRESSION: Pt has made good progress over the course of skilled ST sessions. While he continues to have some physical symptoms related to concussion, he is education on management and strategies. At this time, pt's cognitive abilities are solidly within the average range and is appropriate for discharge from skilled ST services.    Parsa Rickett B. Rubbie, M.S., CCC-SLP, CBIS Speech-Language Pathologist Certified Brain Injury Specialist [facility]  [facility] [phone] Ascom [phone] Fax [phone]  "

## 2024-03-17 ENCOUNTER — Ambulatory Visit: Payer: Self-pay | Admitting: Speech Pathology

## 2024-03-17 ENCOUNTER — Ambulatory Visit: Payer: Worker's Compensation | Attending: Physical Medicine & Rehabilitation

## 2024-03-17 DIAGNOSIS — R42 Dizziness and giddiness: Secondary | ICD-10-CM | POA: Insufficient documentation

## 2024-03-17 DIAGNOSIS — M542 Cervicalgia: Secondary | ICD-10-CM | POA: Diagnosis present

## 2024-03-17 DIAGNOSIS — F0781 Postconcussional syndrome: Secondary | ICD-10-CM | POA: Diagnosis present

## 2024-03-17 NOTE — Therapy (Signed)
 " OUTPATIENT PHYSICAL THERAPY TREATMENT  Patient Name: Kyle Tran MRN: [id number] DOB:September 12, 1995, 29 y.o., male Today's Date: 03/17/2024  PCP: Geralene Levorn ORN, NP  REFERRING PROVIDER: Babs Arthea ONEIDA, MD  END OF SESSION:  PT End of Session - 03/17/24 1446     Visit Number 5    Number of Visits 16    Date for Recertification  03/30/24    Authorization Type Workers Comp: Authorized for 12 visits    Authorization Time Period 02/03/24-03/30/24    Authorization - Number of Visits 12    Progress Note Due on Visit 10    PT Start Time 1448    PT Stop Time 1530    PT Time Calculation (min) 42 min    Activity Tolerance Patient tolerated treatment well;Patient limited by pain    Behavior During Therapy [facility] for tasks assessed/performed           Past Medical History:  Diagnosis Date   Concussion 10/2023   Past Surgical History:  Procedure Laterality Date   ADENOIDECTOMY     age 52   EXTERNAL EAR SURGERY     HYDROCELE EXCISION / REPAIR     age 71   Patient Active Problem List   Diagnosis Date Noted   Post concussion syndrome 01/21/2024   BPPV (benign paroxysmal positional vertigo), left 01/21/2024   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 Pt performing exercises daily unless he is not feeling well. Pt drove here today for first time by himself. Pt reports continued sound and light sensitivity. He says headache not that bad at the moment, but still present (currently). Pt reports he can have pain in his  eyes, seems random, can happens whether driving or just sitting down.    PAIN:  6-7/10 left ocular headache, top of head (see distribution map below)  NAUSEA:  0/10  PERTINENT HISTORY:   Kyle Tran is a 28yoM referred to OPPT vestibular rehab by physiatry following a head injury at work. Pt reports rising to full upright stance during a mechanical task and hitting the top/back of his head on a hydraulic lift arm. Pt denies LOC. Pt reports initially  being very limited by vertigo, dizziness, HA which resolved over days to weeks. In most recent weeks prior to evaluation here, pt denies neck pain, but endorses vertigo/e triggers with repeated left head turning and leftward gaze, he relates to needs for highway driving. Pt is still very much unable to tolerate most head movements for driving, but is tolerant as a front seat passenger. Pt reports 2 other primary triggers as flashing and/or bright lights which can result trigger migraine aura pt seeing bright flashing lights not present, and loud noises can trigger brief, loud Left sided tinnitus several times daily, typically 20sec duration. Pt has been going to the gym with mom 2-3x weekly, mostly treadmill walking, sometimes resistance training, trying to find a degree of exertion that does not trigger migraine symptoms. Pt reports significant sleep disruption overall, continues to attempt a regular sleep schedule as recommended by MD, but has responded unfavorably to medications to help with sleep. EMR review indication pt being followed in 2023 for chronic HA + fatigue+ memory concerns.   EMR review shows prior head injury in 2021- he had a striking head injury and that in the past six months he feels his short term memory has diminished, particularly while working. Pt pursued laceration repair at [facility] 07/2019 after striking his head on corner of car at work, no loss of consciousness reported at that time and no indication for imaging. He followed  up with [facility] 0611/21 for this issue, head x-ray at that time was negative and pt was diagnosed with post-traumatic headache. 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   Established with neurology Dr. Maree in 2023- recommended sleep study to evaluate OSA, recommended being established with opthalmolog, brain MRI reassuring, referral to neuropsychology for further cognitive assessment, vitamin D supplementation. Does not appear pt followed up with neurology after July 2023, despite recommendations.  PAIN PATTERN AT EVALULATION:  Are you having pain? 6/10 headache at present, elevated since waking today ,non throbbing, in blue distribution. Pt has HA daily, lowest severity in past week 3/10. Patient describes orange distribution as pattern of HA when having migraine, occurs daily.       PRECAUTIONS: photophobia, phonophobia   WEIGHT BEARING RESTRICTIONS: None  FALLS: Has patient fallen in last 6 months? 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  OBJECTIVE:  Note: Objective measures were completed at evaluation unless otherwise noted.  DIAGNOSTIC FINDINGS:   HeadCT in 2021: reassuring findings  Brain MRI: 2023: unremarkable  COGNITION: Overall cognitive status: Not evaluated by provider; pt has a referral to speech therapy for high-level cognitive screening. *see SLP note for detail.    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  *pt was seen in UC in 2023 with heart palpitations, sense of missing beats, EKG normal at that time, no definitive workup.      Vitals   02/12/24 1630  BP: (!) 129/95  Pulse: 84  SpO2: 99%   There is no height  or weight on file to calculate BMI.     Oculomotor Screening: 02/03/24   Gaze fixation Continuous twitching without any clear pattern  horizontal pursuits WNL  Rt/Lt  gaze holding WNL, nystagmus free  vertical pursuits WNL  up/down gaze holding WNL  horizontal saccades   intermittent small  undershooting  vergence  16cm   Horizontal VOR WNL  Vision suppressed:   neutral gaze, spontaneous nystagmus Deferred to visit 2  Rt/Lt  gaze holding Deferred to visit 2  up/down gaze holding Deferred to visit 2   VESTIBULAR SCREENING: Cervical rotation ROM:  Right: 75 degrees, no pain or symptoms   Left: 65 degrees, makes left eye feel 'funny', no neck pain, no dizziness  Cross cover test:  -negative  Head Impulse Test: -Right: negative   -Left: negative   CANAL TESTING: -not indicated as of time of visit 2: pt denies transient vertigo, dizziness, N/V, or vision phenomenon with rolling in bed, moving from sitting EOPB to/from supine.    GAIT: -Pt has been AMB on treadmill at gym prior to evlauation, up to 1 mile at 3.0 mph, often has onset migraine symptoms immediately following activity, and is trying to decrease to less triggering amount of time.   -in clinic pt AMB 435ft overground, no device, no LOB, no trouble with various lighting types except when glancing and looking directly into LED overhead light fixture.  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 Left temporalis: painful, unfamiliar  4. Left upper trapezius, near [facility] joint: painful, unfamiliar  5. Left upper trapezius, mid belly: painful, unfamiliar  6. Left splenius capitus, mid belly: painful, congruent with HA symptoms  A. Resolved with sustained ischemic release  7. Left suboccipital group, lateral: congruent with HA symptoms   A. Resolved with sustained ischemic release 8. Left suboccipital group, superior: congruent with HA symptoms A. Resolved with sustained ischemic release 9. Left Levator Palpebrae Superioris: tenderness, familiar symptoms  A. Defer release to later session.   PATIENT SURVEYS:  Dizziness Handicap Inventory: 52% (02/12/24)  Post Concussion Symptoms Score: 48.5% (02/12/24)                                                                                                                               TREATMENT DATE 02/03/24:    Self-care/home management: PT advises against treadmill as form of cardio training due to increased likelihood of triggering symptoms, recommends walking track or exercise bike -PT  reviewed symptom modulation techniques -PT recommended possible referral for opthalmology   NMR: Seated- UT stretch x45-60 sec, x30 sec each  side Cervical rotation stretch x30 sec each side  Scapular squeezes 10x with 3 sec hold/rep - reports some muscle soreness  Cerv ext - attempts a few reps- symptom limited Chin tuck supine on plinth 8x - symptom limited  Thoracic ext with cerv ext over chair - pain limited  --Pt only completed thoracic ext component for second set and reports pain was not as bad Ice donned to L shoulder for symptom modulation  Manual- Seated TrP release to TrPs found in bilat UT (improved on R side and and dissipated somewhat on L side), STM to rhomboids,  cervical paraspinals and upper thoracic paraspinals   TE: Trialed different cardio equip. Pt able to complete 1.5 min octane fitness without symptoms. Pt attempted nustep but too jarring for symptoms. Plan to attempt Octane for longer duration future visits (limited only by time today)   PATIENT EDUCATION: Education details: See above Person educated: Cam  Education method: collaborative learning, deliberate practice, positive reinforcement, explicit instruction, establish rules. Education comprehension: Good   HOME EXERCISE PROGRAM: Access Code: [email or web address] URL: [email or web address] Date: 02/20/2024 Prepared by: Peggye Linear d Exercises - Seated Gaze Stabilization with Head Rotation  - 2-3 x daily - 3 reps - 60sec hold - Pencil Pushups  - 2-3 x daily - 2 sets - 15 reps - 3sec hold - End range flexion, then add Left rotation  - 2-3 x daily - 2 reps - 60 hold - Seated Cervical Rotation AROM  - 2 x daily - 1 sets - 10 reps - 5sec hold - Seated Cervical Rotation AROM (Mirrored)  - 2 x daily - 1 sets - 10 reps - 5sec hold - Seated Cervical Flexion AROM  - 2 x daily - 1 sets - 10 reps - 10sec hold - Seated Cervical Extension AROM  - 2 x daily - 1 sets - 15 reps - Seated Horizontal Saccades  - 2 x daily - 2 sets - 30 reps - 2sec hold - VOR Cancellation  - 2 x daily - 1 sets - 30 reps  *Issued in paper on 02/23/24   GOALS: Goals reviewed with patient? No  SHORT TERM GOALS: Target date: 03/04/24  Pt to demonstrate 15x left head turns in <90sec without increased vertigo or migraine.  Baseline: Goal status: INITIAL  2.  Pt to report tolerance to daily walking moderate intensity >15 minutes to promote brain recovery and general wellness.  Baseline:  Goal status: INITIAL  3.  Pt to report regular performance and compliance with HEP for visual accomodation training.  Baseline:  Goal status: INITIAL  4. Pt to report tolerance to driving >89 minutes without increased  migraine or dizziness symptoms.   Baseline:   Goal Status: INITIAL  LONG TERM GOALS: Target date: 03/30/24  Pt to show >25% improvement on Post Concussion Symptom Scale (PCSS)  Baseline: PCSS 48.5% 02/12/24 Goal status: INITIAL  2.  Pt to report tolerance of AMB >1min 3x/week or greater without increased migraine or vertigo to indicate progress toward readiness for return to IADL.  Baseline:  Goal status: INITIAL  3.  Pt to report ability to perform moderate intensity resistance exercise >5min, 2-3x/week or greater to show improved readiness for return to working on his cars and perform heavy home care activity.  Baseline:  Goal status: INITIAL  4.  Pt to report ability to tolerance driving >59 minutes without increased dizziness or migraine to allow for return to driving to work.  Baseline:  Goal status: INITIAL  ASSESSMENT:  CLINICAL IMPRESSION: Pt demonstrates improvement with report of driving self to therapy today. While pt with some improvement, he was generally pain-limited by onset of headache symptoms with mobility-based exercises, particularly with cervical ext and chin tuck.  PT also recommended pt walk or use exercise bike instead of treadmill to reduce symptom exacerbations. Patient will benefit from skilled physical therapy intervention to reduce deficits and impairments identified in evaluation, in order to reduce pain, improve quality of life, and maximize activity tolerance for ADL, IADL, and leisure/fitness. Physical therapy will help pt achieve long and short term goals of care.    OBJECTIVE IMPAIRMENTS: Decreased knowledge of condition, decreased use of DME, decreased mobility, difficulty walking, decreased strength, decreased ROM. ACTIVITY LIMITATIONS: Lifting, standing, walking, squatting, transfers, locomotion level PARTICIPATION LIMITATIONS: Cleaning, laundry, interpersonal relationships, driving, yardwork, community activity.  PERSONAL FACTORS: Age, behavior  pattern, education, past/current experiences, transportation, profession  are also affecting patient's functional outcome.  REHAB POTENTIAL: Good CLINICAL DECISION MAKING: Medium  EVALUATION COMPLEXITY: Moderate    PLAN:  PT FREQUENCY: 1x/week PT DURATION: 12 weeks  PLANNED INTERVENTIONS: 97110-Therapeutic exercises, 97530- Therapeutic activity, 97112- Neuromuscular re-education, [phone]- Self Care, 02859- Manual therapy, [phone]- Gait training, [phone]- Electrical stimulation (unattended), [phone]- Electrical stimulation (manual), Patient/Family education, Balance training, Stair training, Vestibular training, Visual/preceptual remediation/compensation, Cognitive remediation, Cryotherapy, and Moist heat  PLAN FOR NEXT SESSION:  -overground AMB increase, self selected pace -cervicogenic mysofascial release for HA  -review recent HEP updates  4:50 PM, 03/17/2024 Darryle Patten PT, DPT  Physical Therapist - [facility]  [facility] [phone]     Darryle JONELLE Patten, PT 03/17/2024, 4:50 PM        "

## 2024-03-25 ENCOUNTER — Ambulatory Visit: Payer: Self-pay | Admitting: Speech Pathology

## 2024-03-25 ENCOUNTER — Ambulatory Visit: Payer: Worker's Compensation

## 2024-03-25 DIAGNOSIS — R42 Dizziness and giddiness: Secondary | ICD-10-CM

## 2024-03-25 DIAGNOSIS — M542 Cervicalgia: Secondary | ICD-10-CM

## 2024-03-25 DIAGNOSIS — F0781 Postconcussional syndrome: Secondary | ICD-10-CM

## 2024-03-25 NOTE — Therapy (Signed)
 " OUTPATIENT PHYSICAL THERAPY TREATMENT  Patient Name: Kyle Tran MRN: [id number] DOB:Feb 26, 1996, 29 y.o., male Today's Date: 03/25/2024  PCP: Geralene Levorn ORN, NP  REFERRING PROVIDER: Geralene Levorn ORN, NP  END OF SESSION:  PT End of Session - 03/25/24 1621     Visit Number 6    Number of Visits 16    Date for Recertification  03/30/24    Authorization Type Workers Comp: Authorized for 12 visits    Authorization Time Period 02/03/24-03/30/24    Authorization - Visit Number 5    Authorization - Number of Visits 12    Progress Note Due on Visit 10    PT Start Time 1615    PT Stop Time 1655    PT Time Calculation (min) 40 min    Activity Tolerance Patient tolerated treatment well;Patient limited by pain    Behavior During Therapy [facility] for tasks assessed/performed           Past Medical History:  Diagnosis Date   Concussion 10/2023   Past Surgical History:  Procedure Laterality Date   ADENOIDECTOMY     age 40   EXTERNAL EAR SURGERY     HYDROCELE EXCISION / REPAIR     age 89   Patient Active Problem List   Diagnosis Date Noted   Post concussion syndrome 01/21/2024   BPPV (benign paroxysmal positional vertigo), left 01/21/2024   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   Sleep disorder 01/21/2024    ONSET DATE: August 2025  REFERRING DIAG: Post concussion syndrome, vertigo, diplopia   THERAPY DIAG:  Dizziness and giddiness  Cervicalgia  Post concussion syndrome  Rationale for Evaluation and Treatment: Rehabilitation  SUBJECTIVE:                                                                                                                                                                                             SUBJECTIVE STATEMENT:  Pt continues to do well overall, reports mostly limited by photophobia and phonophobia, but neck has been better, HA better. Pt reports less neck issues. Pt says his HEP is performed often.   PAIN:  None  NAUSEA:   0/10  PERTINENT HISTORY:   Kyle Tran is a 28yoM referred to OPPT vestibular rehab by physiatry following a head injury at work. Pt reports rising to full upright stance during a mechanical task and hitting the top/back of his head on a hydraulic lift arm. Pt denies LOC. Pt reports initially being very limited by vertigo, dizziness, HA which resolved over days to weeks. In most recent weeks prior to evaluation here, pt denies neck pain, but endorses vertigo/e  triggers with repeated left head turning and leftward gaze, he relates to needs for highway driving. Pt is still very much unable to tolerate most head movements for driving, but is tolerant as a front seat passenger. Pt reports 2 other primary triggers as flashing and/or bright lights which can result trigger migraine aura pt seeing bright flashing lights not present, and loud noises can trigger brief, loud Left sided tinnitus several times daily, typically 20sec duration. Pt has been going to the gym with mom 2-3x weekly, mostly treadmill walking, sometimes resistance training, trying to find a degree of exertion that does not trigger migraine symptoms. Pt reports significant sleep disruption overall, continues to attempt a regular sleep schedule as recommended by MD, but has responded unfavorably to medications to help with sleep. EMR review indication pt being followed in 2023 for chronic HA + fatigue+ memory concerns.   EMR review shows prior head injury in 2021- he had a striking head injury and that in the past six months he feels his short term memory has diminished, particularly while working. Pt pursued laceration repair at [facility] ED 07/2019 after striking his head on corner of car at work, no loss of consciousness reported at that time and no indication for imaging. He followed up with [facility] 0611/21 for this issue, head x-ray at that time was negative and pt was diagnosed with post-traumatic headache. 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   Established with neurology Dr. Maree in 2023- recommended sleep study to evaluate OSA, recommended being established with opthalmolog, brain MRI reassuring, referral to neuropsychology for further cognitive assessment, vitamin D supplementation. Does not appear pt followed up with neurology after July 2023, despite recommendations.  PAIN PATTERN AT EVALULATION:  Are you having pain? 6/10 headache at present, elevated since waking today ,non throbbing, in blue distribution. Pt has HA daily, lowest severity in past week 3/10. Patient describes orange distribution as pattern of HA when having migraine, occurs daily.       PRECAUTIONS: photophobia, phonophobia   WEIGHT BEARING RESTRICTIONS: None  FALLS: Has patient fallen in last 6 months? No  LIVING ENVIRONMENT: Lives with: Mother (pt in between roommates/apartments)  *mom assists with transportation as of evaluation due to post concussion intolerance to driving  PLOF: works full time as a curator at [employer], [employer]video in [employer]   PATIENT GOALS: Return to work, return to unrestricted exercise and unrestricted working on his own cars.  OBJECTIVE:  Note: Objective measures were completed at evaluation unless otherwise noted.  DIAGNOSTIC FINDINGS:   HeadCT in 2021: reassuring findings  Brain MRI: 2023: unremarkable  COGNITION: Overall cognitive status: Not evaluated by provider; pt has a referral to speech therapy for high-level cognitive screening. *see SLP note for detail.    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  *pt was seen in UC in 2023 with heart palpitations, sense of missing beats, EKG normal at that time, no definitive workup.       Vitals   02/12/24 1630  BP: (!) 129/95  Pulse: 84  SpO2: 99%   There is no height or weight on file to calculate BMI.     Oculomotor Screening: 02/03/24   Gaze fixation Continuous twitching without any clear pattern  horizontal pursuits WNL  Rt/Lt  gaze holding WNL, nystagmus free  vertical pursuits WNL  up/down gaze holding WNL  horizontal saccades   intermittent small  undershooting  vergence  16cm   Horizontal VOR WNL  Vision suppressed: 03/25/24   Hor VOR cancellation  Heavy beat disruption at self selected speed, but improved quilty with slower speed   Vert VOR cancellation  moderate beat disruption at self selected speed, but improved quilty with slower speed       VESTIBULAR SCREENING: Cervical rotation ROM:  Right: 75 degrees, no pain or symptoms   Left: 65 degrees, makes left eye feel 'funny', no neck pain, no dizziness  Cross cover test:  -negative  Head Impulse Test: -Right: negative   -Left: negative   CANAL TESTING: -not indicated as of time of visit 2: pt denies transient vertigo, dizziness, N/V, or vision phenomenon with rolling in bed, moving from sitting EOPB to/from supine.    GAIT: -Pt has been AMB on treadmill at gym prior to evlauation, up to 1 mile at 3.0 mph, often has onset migraine symptoms immediately following activity, and is trying to decrease to less triggering amount of time.   -in clinic pt AMB 438ft overground, no device, no LOB, no trouble with various lighting types except when glancing and looking directly into LED overhead light fixture.  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 Left temporalis: painful, unfamiliar  4. Left upper trapezius, near [facility] joint: painful, unfamiliar  5. Left upper trapezius, mid belly: painful, unfamiliar  6. Left splenius capitus, mid belly: painful, congruent with HA symptoms  A. Resolved with sustained ischemic release  7. Left suboccipital group, lateral: congruent with HA symptoms   A. Resolved with sustained ischemic release 8. Left suboccipital group, superior: congruent with HA symptoms A. Resolved with sustained ischemic release 9. Left Levator Palpebrae Superioris: tenderness, familiar symptoms  A. Defer release to later session.   PATIENT SURVEYS:  Dizziness Handicap Inventory: 52% (02/12/24)  Post Concussion Symptoms Score: 48.5% (02/12/24)                                                                                                                               TREATMENT DATE 02/03/24:   -discussion of updates at gym, use of bike; reports HR continues to get really high with this, into 180s sometimes; recommended using apple watch as a second device measurement for accuracy. Hx of palpitation-like symptoms in the past, and recently has occaional HR elevation when taking the dog out.  -Hor VOR cancellation with goggles x60sec, cues for slowing velocity until  improved ocular control is achieved, then same for vert VOR cancellation x60sec -review video with patient and explantion of goals with VOR cancellation at home.  -Pt performs Hor VOR cancellation with back of playing card x60sec at slower speed,then 30sec at previous speed  *reports he can feel a difference with vsual acuity between the two  -AMB 317ft in clinic while sorting a deck of cards by suit -seated, eyes closed recovery with diaphragmatic breathing  -AMB 341ft in clinic while sorting a deck of cards by suit -seated, eyes closed recovery with diaphragmatic  breathing  -AMB overground with horizontal head turns calling out card 3x71ft bilat   *left head turns require more time to achieve focus  PATIENT EDUCATION: Education details: See above Person educated: Cam  Education method: collaborative learning, deliberate practice, positive reinforcement, explicit instruction, establish rules. Education comprehension: Good   HOME EXERCISE PROGRAM: Access Code: [email or web address] URL: [email or web address] Date: 02/20/2024 Prepared by: Peggye Linear d Exercises - Seated Gaze Stabilization with Head Rotation  - 2-3 x daily - 3 reps - 60sec hold - Pencil Pushups  - 2-3 x daily - 2 sets - 15 reps - 3sec hold - End range flexion, then add Left rotation  - 2-3 x daily - 2 reps - 60 hold - Seated Cervical Rotation AROM  - 2 x daily - 1 sets - 10 reps - 5sec hold - Seated Cervical Rotation AROM (Mirrored)  - 2 x daily - 1 sets - 10 reps - 5sec hold - Seated Cervical Flexion AROM  - 2 x daily - 1 sets - 10 reps - 10sec hold - Seated Cervical Extension AROM  - 2 x daily - 1 sets - 15 reps - Seated Horizontal Saccades  - 2 x daily - 2 sets - 30 reps - 2sec hold - VOR Cancellation  - 2 x daily - 1 sets - 30 reps  *Issued in paper on 02/23/24   GOALS: Goals reviewed with patient? No  SHORT TERM GOALS: Target date: 03/04/24  Pt to demonstrate 15x left head turns in <90sec without increased vertigo or migraine.  Baseline: 03/25/24; able to perform >50 head turns AMB in hallway without onset vertigo/migraine Goal status: MET  2.  Pt to report tolerance to daily walking moderate intensity >15 minutes to promote brain recovery and general wellness.  Baseline: 03/25/24: able to do some intermittent walking, weights, bike at gym, inconsistent schedule with vairable HR response.  Goal status: Progressing  3.  Pt to report regular performance and compliance with HEP for visual accomodation training.  Baseline: 03/25/24: compliant and consistent.  Goal  status: MET  4. Pt to report tolerance to driving >89 minutes without increased migraine or dizziness symptoms.   Baseline: 03/25/24: has been driving consistently each day for brief periods.   Goal Status: INITIAL  LONG TERM GOALS: Target date: 03/30/24  Pt to show >25% improvement on Post Concussion Symptom Scale (PCSS)  Baseline: PCSS 48.5% 02/12/24 Goal status: INITIAL  2.  Pt to report tolerance of AMB >41min 3x/week or greater without increased migraine or vertigo to indicate progress toward readiness for return to IADL.  Baseline:  Goal status: INITIAL  3.  Pt to report ability to perform moderate intensity resistance exercise >27min, 2-3x/week or greater to show improved readiness for return to working on his cars and perform heavy home care activity.  Baseline:  Goal status: INITIAL  4.  Pt to report ability to tolerance driving >59 minutes without increased dizziness or  migraine to allow for return to driving to work.  Baseline:  Goal status: INITIAL   ASSESSMENT:  CLINICAL IMPRESSION: Brief driving continues to be a success, left head turns and visual acuity heavily dependent on migraine symptoms and fatigue. Took time to work on VOR cancellation with video capture today, more difficulty with horizontal VOR cancellation, abnormal reflex easily overrides static eye posturing. Pt has better performance with cues for slower speed, likely would be better to perform this way at home with HEP, however difficult for patient to perceive the difference subjectively. Latter portion working on gait based VOR cancellation in static and dynamic head use scenarios. Minimal symptoms aggravation in session, but left eye function varies with fatigue, parasynmpathetic oriented seated breaks promoted between bouts using breathing to downregulate nervous system. Discussed HR issues at gfym, recommend use of apple watch for secondary monitoring, mention to MD at FU next week. Patient will benefit from  skilled physical therapy intervention to reduce deficits and impairments identified in evaluation, in order to reduce pain, improve quality of life, and maximize activity tolerance for ADL, IADL, and leisure/fitness. Physical therapy will help pt achieve long and short term goals of care.    OBJECTIVE IMPAIRMENTS: Decreased knowledge of condition, decreased use of DME, decreased mobility, difficulty walking, decreased strength, decreased ROM. ACTIVITY LIMITATIONS: Lifting, standing, walking, squatting, transfers, locomotion level PARTICIPATION LIMITATIONS: Cleaning, laundry, interpersonal relationships, driving, yardwork, community activity.  PERSONAL FACTORS: Age, behavior pattern, education, past/current experiences, transportation, profession  are also affecting patient's functional outcome.  REHAB POTENTIAL: Good CLINICAL DECISION MAKING: Medium  EVALUATION COMPLEXITY: Moderate    PLAN:  PT FREQUENCY: 1x/week PT DURATION: 12 weeks  PLANNED INTERVENTIONS: 97110-Therapeutic exercises, 97530- Therapeutic activity, 97112- Neuromuscular re-education, [phone]- Self Care, 02859- Manual therapy, [phone]- Gait training, [phone]- Electrical stimulation (unattended), [phone]- Electrical stimulation (manual), Patient/Family education, Balance training, Stair training, Vestibular training, Visual/preceptual remediation/compensation, Cognitive remediation, Cryotherapy, and Moist heat  PLAN FOR NEXT SESSION:  -FU on use of Apple watch for HR monitoring at gym with bike and dog walking -FU on heartburn issue  4:52 PM, 03/25/24 Peggye JAYSON Linear, PT, DPT Physical Therapist - [facility]  Outpatient Physical Therapy- Main Campus [phone]           "

## 2024-03-30 ENCOUNTER — Ambulatory Visit: Payer: Worker's Compensation

## 2024-03-30 ENCOUNTER — Ambulatory Visit: Payer: Self-pay | Admitting: Speech Pathology

## 2024-03-31 ENCOUNTER — Ambulatory Visit: Payer: Self-pay | Attending: Physical Medicine & Rehabilitation

## 2024-03-31 ENCOUNTER — Encounter: Payer: Self-pay | Attending: Physical Medicine & Rehabilitation | Admitting: Physical Medicine & Rehabilitation

## 2024-03-31 ENCOUNTER — Encounter: Payer: Self-pay | Admitting: Physical Medicine & Rehabilitation

## 2024-03-31 ENCOUNTER — Ambulatory Visit: Payer: Worker's Compensation

## 2024-03-31 DIAGNOSIS — G479 Sleep disorder, unspecified: Secondary | ICD-10-CM | POA: Insufficient documentation

## 2024-03-31 DIAGNOSIS — H8112 Benign paroxysmal vertigo, left ear: Secondary | ICD-10-CM | POA: Insufficient documentation

## 2024-03-31 DIAGNOSIS — F0781 Postconcussional syndrome: Secondary | ICD-10-CM | POA: Insufficient documentation

## 2024-03-31 DIAGNOSIS — R42 Dizziness and giddiness: Secondary | ICD-10-CM | POA: Diagnosis not present

## 2024-03-31 DIAGNOSIS — M542 Cervicalgia: Secondary | ICD-10-CM | POA: Insufficient documentation

## 2024-03-31 DIAGNOSIS — G44329 Chronic post-traumatic headache, not intractable: Secondary | ICD-10-CM | POA: Insufficient documentation

## 2024-03-31 MED ORDER — TRAZODONE HCL 100 MG PO TABS: 100.0000 mg | ORAL_TABLET | Freq: Every day | ORAL | 4 refills | Status: AC

## 2024-03-31 MED ORDER — TOPIRAMATE 25 MG PO TABS: 25.0000 mg | ORAL_TABLET | Freq: Every day | ORAL | 3 refills | Status: AC

## 2024-03-31 NOTE — Patient Instructions (Signed)
" °  VISIT SUMMARY: During your visit, we discussed your ongoing postconcussion syndrome and its associated symptoms, including sleep disturbance, chronic headaches, cognitive impairment, vestibular dysfunction, and driving difficulties. We reviewed your current treatments and made adjustments to better manage your symptoms.  YOUR PLAN: -POSTCONCUSSION SYNDROME: Postconcussion syndrome is a complex disorder with various symptoms such as headaches, dizziness, and cognitive difficulties that persist after a concussion. We will continue to monitor your progress in physical and speech therapy, and I encourage you to involve your attorney regarding the denial of your workers' compensation claim.  -CHRONIC POST-TRAUMATIC HEADACHE: Chronic post-traumatic headaches are persistent headaches that occur after a head injury and can be worsened by poor sleep. We have started you on topiramate  at bedtime and will monitor your response to this medication. Please report any adverse effects.  -SLEEP DISORDER: A sleep disorder involves difficulty in falling or staying asleep, which can worsen other symptoms. We have increased your trazodone  dose to 100 mg at bedtime. Take your sleep medication at 7:30-8:00 PM as part of your bedtime routine. If you experience any problems with the higher dose, please call for a prompt adjustment.  -BENIGN PAROXYSMAL POSITIONAL VERTIGO, LEFT: Benign paroxysmal positional vertigo (BPPV) is a condition that causes brief episodes of dizziness related to changes in head position. Continue with physical therapy and avoid unsafe activities during significant vertigo or visual symptoms. If driving becomes difficult, pull over to ensure your safety.  INSTRUCTIONS: Please follow up with your therapists and ensure they communicate directly regarding any concerns. Monitor your response to topiramate  and report any adverse effects. Take your sleep medication at 7:30-8:00 PM and call if you experience  any problems with the higher dose. Avoid unsafe activities during significant vertigo or visual symptoms, and pull over if driving becomes difficult. Involve your attorney regarding the denial of your workers' compensation claim.    Contains text generated by Abridge.   "

## 2024-03-31 NOTE — Progress Notes (Signed)
 "  Subjective:    Patient ID: Kyle Tran, male    DOB: Sep 28, 1995, 28 y.o.   MRN: [id number]  HPI  Discussed the use of AI scribe software for clinical note transcription with the patient, who gave verbal consent to proceed.  History of Present Illness Kyle Tran is a 30 year old male with postconcussion syndrome who presents for follow-up of persistent post-traumatic symptoms.  Sleep Disturbance: - Persistent severe sleep impairment despite medication adjustments - Initiated trazodone , starting with half-tablets for one week, then full tablets - No improvement in sleep duration or quality; first night on trazodone  resulted in only three hours of sleep, followed by three consecutive days without sleep - Ongoing trazodone  use provided no benefit; mother observed he appeared 'wired'  Chronic Headache and Sensory Sensitivity: - Chronic headaches with variable severity, ranging from manageable to severely debilitating - Associated photophobia and phonophobia - Exposure to light and sound, especially during family gatherings, is overwhelming and necessitates retreat to a quiet, dark environment  Cognitive Impairment: - Increased errors in daily tasks and issues with memory and concentration compared to pre-injury baseline - Sleep disturbance exacerbates cognitive symptoms - Speech therapy assessment indicated age-appropriate performance  Vestibular Dysfunction and Exercise Intolerance: - Persistent balance issues and vertigo - Attends physical therapy with some benefit, but nystagmus remains evident - Limited exercise tolerance: walking on treadmill for 15 minutes induces lightheadedness; stationary cycling for five minutes results in marked tachycardia (up to 186 bpm) and lightheadedness, requiring cessation of activity  Driving Difficulties: - First solo distance drive complicated by nausea after eating and exacerbation of symptoms when looking to the left - Night driving  is particularly difficult due to heightened sensitivity to taillights, resulting in overwhelming symptoms and need to rush home  Rehabilitation and Therapy Access: - Continues cognitive rehabilitation with [facility] - Previously completed speech therapy with [facility], focusing on memory exercises and detection tasks - No longer receiving workers' compensation coverage for therapy; now paying out of pocket - Physical therapy appointments recently canceled - Seeking legal assistance regarding claim denial   Pain Inventory Average Pain 4 Pain Right Now 4 My pain is constant, sharp, stabbing, aching, and sharp pain in right eye  In the last 24 hours, has pain interfered with the following? General activity 4 Relation with others 5-6 Enjoyment of life 4 What TIME of day is your pain at its worst? morning , daytime, evening, and night Sleep (in general) Poor  Pain is worse with: some activites Pain improves with: rest and heat/ice Relief from Meds: 0  No family history on file. Social History   Socioeconomic History   Marital status: Single    Spouse name: Not on file   Number of children: Not on file   Years of education: Not on file   Highest education level: Not on file  Occupational History   Not on file  Tobacco Use   Smoking status: Never   Smokeless tobacco: Never  Vaping Use   Vaping status: Never Used  Substance and Sexual Activity   Alcohol use: Yes    Comment: rarely   Drug use: Not Currently   Sexual activity: Not on file  Other Topics Concern   Not on file  Social History Narrative   Not on file   Social Drivers of Health   Tobacco Use: Low Risk (03/31/2024)   Patient History    Smoking Tobacco Use: Never    Smokeless Tobacco Use: Never  Passive Exposure: Not on file  Financial Resource Strain: Low Risk (04/27/2021)   Received from [facility]   Overall Financial Resource Strain (CARDIA)    Difficulty of Paying Living Expenses: Not hard  at all  Food Insecurity: No Food Insecurity (11/04/2023)   Received from [facility]   Epic    Within the past 12 months, you worried that your food would run out before you got the money to buy more.: Never true    Within the past 12 months, the food you bought just didn't last and you didn't have money to get more.: Never true  Transportation Needs: No Transportation Needs (11/04/2023)   Received from [facility] - Transportation    Lack of Transportation (Medical): No    Lack of Transportation (Non-Medical): No  Physical Activity: Not on file  Stress: Not on file  Social Connections: Not on file  Depression (PHQ2-9): Medium Risk (03/31/2024)   Depression (PHQ2-9)    PHQ-2 Score: 6  Alcohol Screen: Not on file  Housing: Not on file  Utilities: Low Risk (11/04/2023)   Received from [facility]   Utilities    Within the past 12 months, have you been unable to get utilities(heat, electricity) when it was really needed?: No  Health Literacy: Not on file   Past Surgical History:  Procedure Laterality Date   ADENOIDECTOMY     age 52   EXTERNAL EAR SURGERY     HYDROCELE EXCISION / REPAIR     age 57   Past Surgical History:  Procedure Laterality Date   ADENOIDECTOMY     age 52   EXTERNAL EAR SURGERY     HYDROCELE EXCISION / REPAIR     age 57   Past Medical History:  Diagnosis Date   Concussion 10/2023   BP 134/87 (BP Location: Left Arm, Patient Position: Sitting, Cuff Size: Large)   Pulse 86   Ht 6' 2 (1.88 m)   Wt (!) 305 lb 6.4 oz (138.5 kg)   SpO2 97%   BMI 39.21 kg/m   Opioid Risk Score:   Fall Risk Score:  `1  Depression screen [facility] 2/9     03/31/2024   10:17 AM 01/21/2024   10:08 AM  Depression screen PHQ 2/9  Decreased Interest 1 1  Down, Depressed, Hopeless 1 1  PHQ - 2 Score 2 2  Altered sleeping 2 3  Tired, decreased energy 2 1  Change in appetite 0 2  Feeling bad or failure about yourself  0 0  Trouble concentrating 0 1  Moving  slowly or fidgety/restless 0 2  Suicidal thoughts 0 0  PHQ-9 Score 6 11  Difficult doing work/chores Not difficult at all       Review of Systems  Eyes:  Positive for pain.       Right eye pain (during activities or driving)  Neurological:  Positive for headaches.       Headaches  All other systems reviewed and are negative.      Objective:   Physical Exam  Gen: no distress, normal appearing HEENT: oral mucosa pink and moist, NCAT Cardio: Reg rate Chest: normal effort, normal rate of breathing Abd: soft, non-distended Ext: no edema Psych: pleasant, but flat Skin: intact Neuro: He is alert and oriented x 3.  Demonstrates reasonable insight and awareness.  Cranial nerve exam was nonfocal.  Hearing appeared to be intact as well as vision.  Patient with lateral and  some rotary nystagmus with gaze to   left and than right.  Romberg positive Musculoskeletal: Full ROM, No pain with AROM or PROM in the neck, trunk, or extremities. Posture appropriate.  He was a bit sensitive to palpation over the top of his head along the frontal temporal parietal area.           Assessment & Plan:  29 year old male with postconcussion syndrome due to an injury where he struck his head on the bottom of a car lift.  He has ongoing sleep dysfunction as well as higher level cognitive deficits, vestibular dysfunction, headaches, photo sensitivity and sound sensitivity as well as irritable behavior     Plan: I would like to increase his trazodone  to 100mg  at bedtime. If this doesn't work will consider other options. Asked him to reach out to me as needed. Again I think some of his other symptoms including his headaches cognition and even his mood should improve as his energy levels and sleep are better.  If not we will discuss other options to treat headaches etc. Continue outpatient [facility] for vestibular therapy.  He should respond well to this.  I am okay with him driving as  long as his low traffic and only short distances Trial of topamax  for headaches 25mg  qhs   I spent about 30 min with Kyle Tran in review of his case and treatment plan.  I  He is to remain out of work until I see him back in about 2 mos          "

## 2024-04-08 ENCOUNTER — Ambulatory Visit: Payer: Worker's Compensation

## 2024-04-08 ENCOUNTER — Ambulatory Visit: Payer: Self-pay | Admitting: Speech Pathology

## 2024-04-12 ENCOUNTER — Ambulatory Visit: Payer: Worker's Compensation | Admitting: Physical Therapy

## 2024-04-12 ENCOUNTER — Ambulatory Visit: Payer: Self-pay | Admitting: Speech Pathology

## 2024-04-14 ENCOUNTER — Ambulatory Visit: Payer: Worker's Compensation

## 2024-04-14 ENCOUNTER — Ambulatory Visit: Payer: Self-pay | Admitting: Speech Pathology

## 2024-04-14 ENCOUNTER — Ambulatory Visit: Payer: Self-pay

## 2024-04-14 DIAGNOSIS — F0781 Postconcussional syndrome: Secondary | ICD-10-CM

## 2024-04-14 DIAGNOSIS — R42 Dizziness and giddiness: Secondary | ICD-10-CM

## 2024-04-14 DIAGNOSIS — S069XAS Unspecified intracranial injury with loss of consciousness status unknown, sequela: Secondary | ICD-10-CM

## 2024-04-14 DIAGNOSIS — M542 Cervicalgia: Secondary | ICD-10-CM

## 2024-04-14 NOTE — Therapy (Signed)
 " OUTPATIENT PHYSICAL THERAPY TREATMENT  Patient Name: MERCY MALENA MRN: [id number] DOB:May 08, 1995, 29 y.o., male Today's Date: 04/14/2024  PCP: Geralene Levorn ORN, NP  REFERRING PROVIDER: Babs Arthea ONEIDA, MD  END OF SESSION:  PT End of Session - 04/14/24 1534     Visit Number 8    Number of Visits 16    Date for Recertification  03/30/24    Authorization Type Workers Comp: Authorized for 12 visits    Authorization Time Period 03/31/24-05/26/24    Progress Note Due on Visit 10    PT Start Time 1531    PT Stop Time 1611    PT Time Calculation (min) 40 min    Activity Tolerance Patient tolerated treatment well    Behavior During Therapy [facility] for tasks assessed/performed            Past Medical History:  Diagnosis Date   Concussion 10/2023   Past Surgical History:  Procedure Laterality Date   ADENOIDECTOMY     age 89   EXTERNAL EAR SURGERY     HYDROCELE EXCISION / REPAIR     age 33   Patient Active Problem List   Diagnosis Date Noted   Post concussion syndrome 01/21/2024   BPPV (benign paroxysmal positional vertigo), left 01/21/2024   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   Sleep disorder 01/21/2024    ONSET DATE: August 2025  REFERRING DIAG: Post concussion syndrome, vertigo, diplopia   THERAPY DIAG:  Cervicalgia  Dizziness and giddiness  Post concussion syndrome  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 Slept very well last night with new meds. Still nauseated throwing up sometimes. HA improving after significant light sensitivity  aggravation during snowy weather.   PAIN:  3/10 HA pain, pretty good, slept well last night   NAUSEA:  0/10  PERTINENT HISTORY:   Alfred Harrel is a 28yoM referred to OPPT vestibular rehab by physiatry following a head injury at work. Pt reports rising to full upright stance during a mechanical task and hitting the top/back of his head on a hydraulic lift arm. Pt denies LOC. Pt reports initially being very limited by vertigo, dizziness, HA which resolved over days to weeks.  In most recent weeks prior to evaluation here, pt denies neck pain, but endorses vertigo/e triggers with repeated left head turning and leftward gaze, he relates to needs for highway driving. Pt is still very much unable to tolerate most head movements for driving, but is tolerant as a front seat passenger. Pt reports 2 other primary triggers as flashing and/or bright lights which can result trigger migraine aura pt seeing bright flashing lights not present, and loud noises can trigger brief, loud Left sided tinnitus several times daily, typically 20sec duration. Pt has been going to the gym with mom 2-3x weekly, mostly treadmill walking, sometimes resistance training, trying to find a degree of exertion that does not trigger migraine symptoms. Pt reports significant sleep disruption overall, continues to attempt a regular sleep schedule as recommended by MD, but has responded unfavorably to medications to help with sleep. EMR review indication pt being followed in 2023 for chronic HA + fatigue+ memory concerns.   EMR review shows prior head injury in 2021- he had a striking head injury and that in the past six months he feels his short term memory has diminished, particularly while working. Pt pursued laceration repair at [facility] ED 07/2019 after striking his head on corner of car at work, no loss of consciousness reported at that time and no indication for imaging. He followed up with [facility] 0611/21 for this issue, head x-ray  at that time was negative and pt was diagnosed with post-traumatic headache. 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   Established with neurology Dr. Maree in 2023- recommended sleep study to evaluate OSA, recommended being established with opthalmolog, brain MRI reassuring, referral to neuropsychology for further cognitive assessment, vitamin D supplementation. Does not appear pt followed up with neurology after July 2023, despite recommendations.  PAIN PATTERN AT EVALULATION:  Are you having pain? 6/10 headache at present, elevated since waking today ,non throbbing, in blue distribution. Pt has HA daily, lowest severity in past week 3/10. Patient describes orange distribution as pattern of HA when having migraine, occurs daily.       PRECAUTIONS: photophobia, phonophobia   WEIGHT BEARING RESTRICTIONS: None  FALLS: Has patient fallen in last 6 months? No  LIVING ENVIRONMENT: Lives with: Mother (pt in between roommates/apartments)  *mom assists with transportation as of evaluation due to post concussion intolerance to driving  PLOF: works full time as a curator at [employer], [employer] in [place]   PATIENT GOALS: Return to work, return to unrestricted exercise and unrestricted working on his own cars.  OBJECTIVE:  Note: Objective measures were completed at evaluation unless otherwise noted.  DIAGNOSTIC FINDINGS:   HeadCT in 2021: reassuring findings  Brain MRI: 2023: unremarkable  COGNITION: Overall cognitive status: Not evaluated by provider; pt has a referral to speech therapy for high-level cognitive screening. *see SLP note for detail.    ORTHOSTATIC VITAL SIGNS: 02/03/24 -seated BP: 120/44mmHg 90bpm  -standing BP: 126/114mmHg 146bpm per cuff, 111bpm from pleth -standing BP x 1 minutes: 115/77mmHg 115bpm *pleth waveform concerning for pulsus alternans, contacted PCP office regarding this.  *pt was seen in UC in  2023 with heart palpitations, sense of missing beats, EKG normal at that time, no definitive workup.      Vitals   02/12/24 1630  BP: (!) 129/95  Pulse: 84  SpO2: 99%   There is no height or weight on file to calculate BMI.   VITALS TRACKING FROM Apple  Watch: 04/14/24: resting HR 84, peak HR during exercise today: 162bpm, average walking HR today: 120sbpm  *see media folder    Oculomotor Screening: 02/03/24   Gaze fixation Continuous twitching without any clear pattern  horizontal pursuits WNL  Rt/Lt  gaze holding WNL, nystagmus free  vertical pursuits WNL  up/down gaze holding WNL  horizontal saccades   intermittent small  undershooting  vergence  16cm   Horizontal VOR WNL  Vision suppressed: 03/25/24   Hor VOR cancellation  Heavy beat disruption at self selected speed, but improved quilty with slower speed   Vert VOR cancellation  moderate beat disruption at self selected speed, but improved quilty with slower speed       VESTIBULAR SCREENING: Cervical rotation ROM:  Right: 75 degrees, no pain or symptoms   Left: 65 degrees, makes left eye feel 'funny', no neck pain, no dizziness  Cross cover test:  -negative  Head Impulse Test: -Right: negative   -Left: negative   CANAL TESTING: -not indicated as of time of visit 2: pt denies transient vertigo, dizziness, N/V, or vision phenomenon with rolling in bed, moving from sitting EOPB to/from supine.    GAIT: -Pt has been AMB on treadmill at gym prior to evlauation, up to 1 mile at 3.0 mph, often has onset migraine symptoms immediately following activity, and is trying to decrease to less triggering amount of time.   -in clinic pt AMB 446ft overground, no device, no LOB, no trouble with various lighting types except when glancing and looking directly into LED overhead light fixture.  -gait speed congruent with age/activity level of population norms  -Post AMB vitals consistent with standing vitals taken preAMB; -pleth  continues to show aberrant wave pattern consistent with pulsus alternans, message sent to PCP prior to visit 2    MYOFASCIAL SCREENING:  02/12/24 Left sternocleidomastoid, lower: painful, unfamiliar  Left sternocleidomastoid, upper: painful, burning lateral pain referral to left temporal region of head Resolved with sustained ischemic release  3. Left temporalis: painful, unfamiliar  4. Left upper trapezius, near [facility] joint: painful, unfamiliar  5. Left upper trapezius, mid belly: painful, unfamiliar  6. Left splenius capitus, mid belly: painful, congruent with HA symptoms  A. Resolved with sustained ischemic release  7. Left suboccipital group, lateral: congruent with HA symptoms   A. Resolved with sustained ischemic release 8. Left suboccipital group, superior: congruent with HA symptoms A. Resolved with sustained ischemic release 9. Left Levator Palpebrae Superioris: tenderness, familiar symptoms  A. Defer release to later session.   PATIENT SURVEYS:  Dizziness Handicap Inventory: 52% (02/12/24)  Post Concussion Symptoms Score: 48.5% (02/12/24)                                                                                                                               TREATMENT DATE 04/14/24:   -Apple watch resting HR: 84bpm over last 24 hours -Octane fitness level 7 x7 minutes: terminal HR at 158bpm, pt  reports BOrg RPE as 12, HR recovers to 108bpm after 4 minutes -AMB in hallway, horizontal head turns (3x 14ft bilat)  -AMB in hallway, vertical eye tracking with basketball toss: 8x 62ft   PATIENT EDUCATION: Education details: See above, exercise technique Person educated: Cam  Education method: collaborative learning, deliberate practice, positive reinforcement, explicit instruction, establish rules. Education comprehension: Good   HOME EXERCISE PROGRAM: Access Code: [email or web address] URL: [email or web address] Date: 02/20/2024 Prepared by: Peggye Linear d Exercises -  Seated Gaze Stabilization with Head Rotation  - 2-3 x daily - 3 reps - 60sec hold - Pencil Pushups  - 2-3 x daily - 2 sets - 15 reps - 3sec hold - End range flexion, then add Left rotation  - 2-3 x daily - 2 reps - 60 hold - Seated Cervical Rotation AROM  - 2 x daily - 1 sets - 10 reps - 5sec hold - Seated Cervical Rotation AROM (Mirrored)  - 2 x daily - 1 sets - 10 reps - 5sec hold - Seated Cervical Flexion AROM  - 2 x daily - 1 sets - 10 reps - 10sec hold - Seated Cervical Extension AROM  - 2 x daily - 1 sets - 15 reps - Seated Horizontal Saccades  - 2 x daily - 2 sets - 30 reps - 2sec hold - VOR Cancellation  - 2 x daily - 1 sets - 30 reps  *Issued in paper on 02/23/24   GOALS: Goals reviewed with patient? No  SHORT TERM GOALS: Target date: 03/04/24  Pt to demonstrate 15x left head turns in <90sec without increased vertigo or migraine.  Baseline: 03/25/24; able to perform >50 head turns AMB in hallway without onset vertigo/migraine Goal status: MET  2.  Pt to report tolerance to daily walking moderate intensity >15 minutes to promote brain recovery and general wellness.  Baseline: 03/25/24: able to do some intermittent walking, weights, bike at gym, inconsistent schedule with vairable HR response.  Goal status: Progressing  3.  Pt to report regular performance and compliance with HEP for visual accomodation training.  Baseline: 03/25/24: compliant and consistent.  Goal status: MET  4. Pt to report tolerance to driving >89 minutes without increased migraine or dizziness symptoms.   Baseline: 03/25/24: has been driving consistently each day for brief periods.   Goal Status: INITIAL  LONG TERM GOALS: Target date: 03/30/24  Pt to show >25% improvement on Post Concussion Symptom Scale (PCSS)  Baseline: PCSS 48.5% 02/12/24 Goal status: INITIAL  2.  Pt to report tolerance of AMB >41min 3x/week or greater without increased migraine or vertigo to indicate progress toward readiness for  return to IADL.  Baseline:  Goal status: INITIAL  3.  Pt to report ability to perform moderate intensity resistance exercise >41min, 2-3x/week or greater to show improved readiness for return to working on his cars and perform heavy home care activity.  Baseline:  Goal status: INITIAL  4.  Pt to report ability to tolerance driving >59 minutes without increased dizziness or migraine to allow for return to driving to work.  Baseline:  Goal status: INITIAL   ASSESSMENT:  CLINICAL IMPRESSION: Continues to monitor HR with activity with Apple watch, also sleep data. HR does remain higher than anticipated, but is mildly higher than normal expected rates during sustained aerobic activity, cannot r/o a small effect of low fitness level, but still questionable for higher responsiveness akin to POTS or similar syndrome. Will continue to monitor apple watch metrics going forward. Sleep meds are  helping with sleep, but do cause intermittent nausea, emesis. Pt triggered by bright lights off snow last week quite a bit.   Patient will benefit from skilled physical therapy intervention to reduce deficits and impairments identified in evaluation, in order to reduce pain, improve quality of life, and maximize activity tolerance for ADL, IADL, and leisure/fitness. Physical therapy will help pt achieve long and short term goals of care.    OBJECTIVE IMPAIRMENTS: Decreased knowledge of condition, decreased use of DME, decreased mobility, difficulty walking, decreased strength, decreased ROM. ACTIVITY LIMITATIONS: Lifting, standing, walking, squatting, transfers, locomotion level PARTICIPATION LIMITATIONS: Cleaning, laundry, interpersonal relationships, driving, yardwork, community activity.  PERSONAL FACTORS: Age, behavior pattern, education, past/current experiences, transportation, profession  are also affecting patient's functional outcome.  REHAB POTENTIAL: Good CLINICAL DECISION MAKING: Medium  EVALUATION  COMPLEXITY: Moderate    PLAN:  PT FREQUENCY: 1x/week PT DURATION: 12 weeks  PLANNED INTERVENTIONS: 97110-Therapeutic exercises, 97530- Therapeutic activity, 97112- Neuromuscular re-education, [phone]- Self Care, [id number]- Manual therapy, [phone]- Gait training, [phone]- Electrical stimulation (unattended), [phone]- Electrical stimulation (manual), Patient/Family education, Balance training, Stair training, Vestibular training, Visual/preceptual remediation/compensation, Cognitive remediation, Cryotherapy, and Moist heat  PLAN FOR NEXT SESSION:  -FU on use of Apple watch for HR monitoring at gym with bike and dog walking -FU on heartburn issue -continue VOR training and habituation activities  3:44 PM, 04/14/24 Peggye JAYSON Linear, PT, DPT Physical Therapist - [facility]  Outpatient Physical Therapy- Main Campus [phone]           "

## 2024-04-14 NOTE — Addendum Note (Signed)
 Addended by: JANNET DARRYLE SAUNDERS on: 04/14/2024 07:48 PM   Modules accepted: Orders

## 2024-04-19 ENCOUNTER — Ambulatory Visit: Payer: Worker's Compensation

## 2024-04-19 ENCOUNTER — Ambulatory Visit: Payer: Self-pay | Admitting: Speech Pathology

## 2024-04-21 ENCOUNTER — Ambulatory Visit: Payer: Worker's Compensation

## 2024-04-21 ENCOUNTER — Ambulatory Visit: Payer: Self-pay | Admitting: Speech Pathology

## 2024-05-05 ENCOUNTER — Ambulatory Visit: Payer: Self-pay

## 2024-05-12 ENCOUNTER — Ambulatory Visit: Payer: Self-pay

## 2024-05-19 ENCOUNTER — Ambulatory Visit: Payer: Self-pay

## 2024-05-26 ENCOUNTER — Ambulatory Visit: Payer: Self-pay

## 2024-05-26 ENCOUNTER — Encounter: Payer: Self-pay | Admitting: Physical Medicine & Rehabilitation
